# Patient Record
Sex: Female | Born: 1935 | Race: White | Hispanic: No | Smoking: Never smoker
Health system: Southern US, Community
[De-identification: ages and names within clinical notes are randomized; demographics above are authoritative.]

## PROBLEM LIST (undated history)

## (undated) DIAGNOSIS — R9389 Abnormal findings on diagnostic imaging of other specified body structures: Secondary | ICD-10-CM

## (undated) DIAGNOSIS — F419 Anxiety disorder, unspecified: Secondary | ICD-10-CM

## (undated) DIAGNOSIS — F32A Depression, unspecified: Secondary | ICD-10-CM

## (undated) DIAGNOSIS — E785 Hyperlipidemia, unspecified: Secondary | ICD-10-CM

## (undated) DIAGNOSIS — F329 Major depressive disorder, single episode, unspecified: Secondary | ICD-10-CM

## (undated) DIAGNOSIS — K219 Gastro-esophageal reflux disease without esophagitis: Secondary | ICD-10-CM

## (undated) DIAGNOSIS — S4291XA Fracture of right shoulder girdle, part unspecified, initial encounter for closed fracture: Secondary | ICD-10-CM

## (undated) DIAGNOSIS — R0602 Shortness of breath: Secondary | ICD-10-CM

## (undated) DIAGNOSIS — J189 Pneumonia, unspecified organism: Secondary | ICD-10-CM

## (undated) DIAGNOSIS — J45909 Unspecified asthma, uncomplicated: Secondary | ICD-10-CM

## (undated) DIAGNOSIS — I5189 Other ill-defined heart diseases: Secondary | ICD-10-CM

## (undated) DIAGNOSIS — IMO0002 Reserved for concepts with insufficient information to code with codable children: Secondary | ICD-10-CM

## (undated) DIAGNOSIS — K279 Peptic ulcer, site unspecified, unspecified as acute or chronic, without hemorrhage or perforation: Secondary | ICD-10-CM

## (undated) DIAGNOSIS — Z8719 Personal history of other diseases of the digestive system: Secondary | ICD-10-CM

## (undated) DIAGNOSIS — I10 Essential (primary) hypertension: Secondary | ICD-10-CM

## (undated) DIAGNOSIS — R05 Cough: Secondary | ICD-10-CM

## (undated) DIAGNOSIS — R053 Chronic cough: Secondary | ICD-10-CM

## (undated) DIAGNOSIS — M1990 Unspecified osteoarthritis, unspecified site: Secondary | ICD-10-CM

## (undated) HISTORY — PX: TOE SURGERY: SHX1073

## (undated) HISTORY — PX: DILATION AND CURETTAGE OF UTERUS: SHX78

## (undated) HISTORY — PX: HAMMER TOE SURGERY: SHX385

## (undated) HISTORY — PX: CATARACT EXTRACTION W/ INTRAOCULAR LENS  IMPLANT, BILATERAL: SHX1307

---
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
## 1987-10-15 HISTORY — PX: BREAST BIOPSY: SHX20

## 2000-01-03 ENCOUNTER — Encounter: Payer: Self-pay | Admitting: Geriatric Medicine

## 2000-01-03 ENCOUNTER — Encounter: Admission: RE | Admit: 2000-01-03 | Discharge: 2000-01-03 | Payer: Self-pay | Admitting: Geriatric Medicine

## 2000-04-04 ENCOUNTER — Other Ambulatory Visit: Admission: RE | Admit: 2000-04-04 | Discharge: 2000-04-04 | Payer: Self-pay | Admitting: Geriatric Medicine

## 2000-06-18 ENCOUNTER — Encounter: Payer: Self-pay | Admitting: Geriatric Medicine

## 2000-06-18 ENCOUNTER — Encounter: Admission: RE | Admit: 2000-06-18 | Discharge: 2000-06-18 | Payer: Self-pay | Admitting: Geriatric Medicine

## 2000-06-25 ENCOUNTER — Encounter: Admission: RE | Admit: 2000-06-25 | Discharge: 2000-07-25 | Payer: Self-pay | Admitting: Geriatric Medicine

## 2000-07-14 ENCOUNTER — Emergency Department (HOSPITAL_COMMUNITY): Admission: EM | Admit: 2000-07-14 | Discharge: 2000-07-15 | Payer: Self-pay | Admitting: Emergency Medicine

## 2000-07-18 ENCOUNTER — Encounter: Payer: Self-pay | Admitting: Sports Medicine

## 2000-07-18 ENCOUNTER — Ambulatory Visit (HOSPITAL_COMMUNITY): Admission: RE | Admit: 2000-07-18 | Discharge: 2000-07-18 | Payer: Self-pay | Admitting: Sports Medicine

## 2000-08-01 ENCOUNTER — Ambulatory Visit (HOSPITAL_COMMUNITY): Admission: RE | Admit: 2000-08-01 | Discharge: 2000-08-01 | Payer: Self-pay | Admitting: Sports Medicine

## 2000-08-01 ENCOUNTER — Encounter: Payer: Self-pay | Admitting: Sports Medicine

## 2000-08-14 ENCOUNTER — Encounter: Payer: Self-pay | Admitting: Sports Medicine

## 2000-08-14 ENCOUNTER — Ambulatory Visit (HOSPITAL_COMMUNITY): Admission: RE | Admit: 2000-08-14 | Discharge: 2000-08-14 | Payer: Self-pay | Admitting: Sports Medicine

## 2004-01-10 ENCOUNTER — Encounter: Admission: RE | Admit: 2004-01-10 | Discharge: 2004-01-10 | Payer: Self-pay | Admitting: Geriatric Medicine

## 2004-05-07 ENCOUNTER — Other Ambulatory Visit: Admission: RE | Admit: 2004-05-07 | Discharge: 2004-05-07 | Payer: Self-pay | Admitting: Obstetrics and Gynecology

## 2006-07-11 ENCOUNTER — Encounter: Admission: RE | Admit: 2006-07-11 | Discharge: 2006-07-11 | Payer: Self-pay | Admitting: Geriatric Medicine

## 2008-01-07 ENCOUNTER — Encounter: Admission: RE | Admit: 2008-01-07 | Discharge: 2008-01-07 | Payer: Self-pay | Admitting: Geriatric Medicine

## 2010-03-20 ENCOUNTER — Emergency Department (HOSPITAL_BASED_OUTPATIENT_CLINIC_OR_DEPARTMENT_OTHER): Admission: EM | Admit: 2010-03-20 | Discharge: 2010-03-20 | Payer: Self-pay | Admitting: Emergency Medicine

## 2010-03-20 ENCOUNTER — Ambulatory Visit: Payer: Self-pay | Admitting: Diagnostic Radiology

## 2010-04-13 DIAGNOSIS — R9389 Abnormal findings on diagnostic imaging of other specified body structures: Secondary | ICD-10-CM

## 2010-04-13 HISTORY — DX: Abnormal findings on diagnostic imaging of other specified body structures: R93.89

## 2010-04-27 ENCOUNTER — Ambulatory Visit: Payer: Self-pay | Admitting: Internal Medicine

## 2010-04-27 DIAGNOSIS — H409 Unspecified glaucoma: Secondary | ICD-10-CM | POA: Insufficient documentation

## 2010-04-27 DIAGNOSIS — R05 Cough: Secondary | ICD-10-CM

## 2010-04-27 DIAGNOSIS — R0609 Other forms of dyspnea: Secondary | ICD-10-CM

## 2010-04-27 DIAGNOSIS — K279 Peptic ulcer, site unspecified, unspecified as acute or chronic, without hemorrhage or perforation: Secondary | ICD-10-CM | POA: Insufficient documentation

## 2010-04-27 DIAGNOSIS — E7800 Pure hypercholesterolemia, unspecified: Secondary | ICD-10-CM

## 2010-04-27 DIAGNOSIS — K449 Diaphragmatic hernia without obstruction or gangrene: Secondary | ICD-10-CM | POA: Insufficient documentation

## 2010-04-27 DIAGNOSIS — I10 Essential (primary) hypertension: Secondary | ICD-10-CM | POA: Insufficient documentation

## 2010-04-27 DIAGNOSIS — M1990 Unspecified osteoarthritis, unspecified site: Secondary | ICD-10-CM | POA: Insufficient documentation

## 2010-04-27 DIAGNOSIS — R0989 Other specified symptoms and signs involving the circulatory and respiratory systems: Secondary | ICD-10-CM

## 2010-04-27 DIAGNOSIS — J984 Other disorders of lung: Secondary | ICD-10-CM

## 2010-04-28 DIAGNOSIS — J309 Allergic rhinitis, unspecified: Secondary | ICD-10-CM | POA: Insufficient documentation

## 2010-04-28 DIAGNOSIS — E785 Hyperlipidemia, unspecified: Secondary | ICD-10-CM | POA: Insufficient documentation

## 2010-05-10 ENCOUNTER — Encounter: Admission: RE | Admit: 2010-05-10 | Discharge: 2010-05-10 | Payer: Self-pay | Admitting: Geriatric Medicine

## 2010-06-07 ENCOUNTER — Ambulatory Visit: Payer: Self-pay | Admitting: Internal Medicine

## 2010-08-15 ENCOUNTER — Encounter: Admission: RE | Admit: 2010-08-15 | Discharge: 2010-08-15 | Payer: Self-pay | Admitting: Geriatric Medicine

## 2010-09-28 ENCOUNTER — Ambulatory Visit: Payer: Self-pay | Admitting: Internal Medicine

## 2010-10-14 DIAGNOSIS — J189 Pneumonia, unspecified organism: Secondary | ICD-10-CM

## 2010-10-14 HISTORY — DX: Pneumonia, unspecified organism: J18.9

## 2010-11-04 ENCOUNTER — Encounter: Payer: Self-pay | Admitting: Geriatric Medicine

## 2010-11-13 NOTE — Assessment & Plan Note (Signed)
Summary: rov 1 month///kp   Primary Yicel Shannon/Referring Yuritza Paulhus:  Pete Glatter  CC:  1 month follow up visit-cough -dry throat not the deep cough like before. No SOB and doing good since being on QVAR.Marland Kitchen  History of Present Illness: History of Present Illness: April 27, 2010- 89 yoF who comes today with her daughter-in-law on kind referral by Dr Pete Glatter because of cough. Never smoker. She complains of paroxysmal cough at the level of her throat, starting about 8 months ago. Talking makes it worse and sips or cough drops help. She was tried with a change of BP med off ACEinhibitor, and with a course of azithromycin without much help. She also describes shortness of breath which has been stable over a longer period of time- indefinite. This is worse if she is upset,. It is not associated with wheeze, swelling or palpitation. Also, she had an abnormal CXR in June, with no prior hx pneumonia. Stable left lower lobe consolidation is described on f/u CXR 04/19/10, with enlarged heart and midspine compressions. F/U w/ CT was suggested to R/O mass. She denies waking with cough or dyspnea and denies postnasal drip, dysphagia or reflux. She has heard some wheeze. Only uses rescue inhaler if short of breath.  No hx TB exposure or asthma.  June 07, 2010- allergic rhinitis, Lung nodule, dyspnea.....................DIL here Qvar sample last time seemed to help the dyspnea complaint, used once daily. She is coughing somewhat less. Little sputum or wheeze, no chest pain or palpitation. Dr Pete Glatter is following her lung nodule. He did CT she reports showed a scar, and she goes tomorrow for another CXR.     Preventive Screening-Counseling & Management  Alcohol-Tobacco     Smoking Status: never  Current Medications (verified): 1)  Norvasc 5 Mg Tabs (Amlodipine Besylate) .... Take 1 By Mouth Once Daily 2)  Hydrochlorothiazide 12.5 Mg Caps (Hydrochlorothiazide) .... Take 1 By Mouth Once Daily 3)  Lumigan 0.03 %  Soln (Bimatoprost) .Marland Kitchen.. 1 Drop Into Affected Eye Every Evening At Bedtime 4)  Cosopt 22.3-6.8 Mg/ml Soln (Dorzolamide Hcl-Timolol Mal) .Marland Kitchen.. 1 Drop Into Affected Eye Once Daily 5)  Multivitamins  Caps (Multiple Vitamin) .... Take 1 By Mouth Once Daily 6)  Calcium-Vitamin D 600-200 Mg-Unit Tabs (Calcium-Vitamin D) .... Take 1 By Mouth Two Times A Day 7)  Aspirin 81 Mg Tbec (Aspirin) .... Take 1 By Mouth Once Daily 8)  Tylenol With Codeine #3 300-30 Mg Tabs (Acetaminophen-Codeine) .... Take 1 By Mouth As Needed 9)  Metoprolol Succinate 50 Mg Xr24h-Tab (Metoprolol Succinate) .... Take 1 By Mouth Once Daily 10)  Celexa 40 Mg Tabs (Citalopram Hydrobromide) .... Take 1 By Mouth Once Daily 11)  Proair Hfa 108 (90 Base) Mcg/act Aers (Albuterol Sulfate) .... 2 Puffs Four Times A Day As Needed  Allergies (verified): 1)  ! * Cefaclor 2)  ! * Oxycontin  Past History:  Past Surgical History: Last updated: 04/27/2010 Breast biopsy 1989 Toe surgery  Family History: Last updated: 04/27/2010 Asthma-brother(pt of SN) Heart Disease-2 brothers- 1 had Bypass surgery and the other deceased age 71 of MI Cancer-2 brothers-1 liver(drank ETOH) 1 lung(smoked cigars)1982 "oat- cell"deceased.  Social History: Last updated: 04/27/2010 Widowed with children lives alone non smoker no ETOH   Risk Factors: Smoking Status: never (06/07/2010)  Past Medical History: Cough Abnormal CXR- June, 2011 LLL consolidation/ scar Dyspnea Peptic ulcer disease Hyperlipidemia Hypertension Glaucoma Osteoporosis Diastolic dysfunction Mild aortic insufficiency Allergic Rhinitis  Review of Systems      See HPI  The patient complains of non-productive cough.  The patient denies shortness of breath with activity, shortness of breath at rest, productive cough, coughing up blood, chest pain, irregular heartbeats, acid heartburn, indigestion, loss of appetite, weight change, abdominal pain, difficulty swallowing, sore  throat, tooth/dental problems, headaches, nasal congestion/difficulty breathing through nose, and sneezing.    Vital Signs:  Patient profile:   77 year old female Weight:      140 pounds O2 Sat:      96 % on Room air Pulse rate:   67 / minute BP sitting:   132 / 68  (left arm) Cuff size:   regular  Vitals Entered By: Reynaldo Minium CMA (June 07, 2010 11:06 AM)  O2 Flow:  Room air CC: 1 month follow up visit-cough -dry throat not the deep cough like before. No SOB and doing good since being on QVAR.   Physical Exam  Additional Exam:  General: A/Ox3; pleasant and cooperative, NAD, very alert and appropriate SKIN: no rash, lesions NODES: no lymphadenopathy HEENT: [place]/AT, EOM- WNL, Conjuctivae- clear, PERRLA, TM-WNL, Nose- clear, Throat- clear and wnl, Mallampati  II, missing teeth NECK: Supple w/ fair ROM, JVD- none, normal carotid impulses w/o bruits Thyroid- normal to palpation CHEST: Clear to P&A. Can demonstrate a coarse voluntary cough HEART: RRR, no m/g/r heard ABDOMEN: Soft ,  OZH:YQMV, nl pulses, no edema. Left foot in soft boot  NEURO: Grossly intact to observation      Impression & Recommendations:  Problem # 1:  DYSPNEA ON EXERTION (ICD-786.09)  Bronchitis with dyspnea and cough. We will put her on maintenance Qvar for awhile and discussed mouth care. Her updated medication list for this problem includes:    Hydrochlorothiazide 12.5 Mg Caps (Hydrochlorothiazide) .Marland Kitchen... Take 1 by mouth once daily    Metoprolol Succinate 50 Mg Xr24h-tab (Metoprolol succinate) .Marland Kitchen... Take 1 by mouth once daily    Proair Hfa 108 (90 Base) Mcg/act Aers (Albuterol sulfate) .Marland Kitchen... 2 puffs four times a day as needed    Qvar 40 Mcg/act Aers (Beclomethasone dipropionate) .Marland Kitchen... 2 puffs and rinse mouth, twice daily  Problem # 2:  LUNG NODULE (ICD-518.89)  Probably a scar based on her understanding of the CT done by Dr Pete Glatter. I will leave that to him unless needed.  Medications Added to  Medication List This Visit: 1)  Qvar 40 Mcg/act Aers (Beclomethasone dipropionate) .... 2 puffs and rinse mouth, twice daily  Other Orders: Est. Patient Level III (78469)  Patient Instructions: 1)  Please schedule a follow-up appointment in 4 months. 2)  Script for Qvar 40, to take 2 puffs then rinse yuour mouth, twice daily every day.  3)  You can still take the Proair rescue inhaler 2 puffs, up to 4 times daily, if needed for cough/ wheeze/ chest tightness. 4)  cc Dr Pete Glatter Prescriptions: QVAR 40 MCG/ACT AERS (BECLOMETHASONE DIPROPIONATE) 2 PUFFS AND RINSE MOUTH, TWICE DAILY  #1 x PRN   Entered and Authorized by:   Waymon Budge MD   Signed by:   Waymon Budge MD on 06/07/2010   Method used:   Electronically to        [id number]* (retail)       [address]       [place], [place]  [address]       Ph: [phone] or [phone]       Fax: [phone]   RxID:  [id number]    

## 2010-11-13 NOTE — Assessment & Plan Note (Signed)
Summary: persistant cough x 6 months/apc   Primary Provider/Referring Provider:  Stoneking  CC:  Pulmonary Consult-Dr. Stoneking-persistant cough x  6 months..  History of Present Illness: April 27, 2010- 77 yoF who comes today with her daughter-in-law on kind referral by Dr Pete Glatter because of cough. Never smoker. She complains of paroxysmal cough at the level of her throat, starting about 8 months ago. Talking makes it worse and sips or cough drops help. She was tried with a change of BP med off ACEinhibitor, and with a course of azithromycin without much help. She also describes shortness of breath which has been stable over a longer period of time- indefinite. This is worse if she is upset,. It is not associated with wheeze, swelling or palpitation. Also, she had an abnormal CXR in June, with no prior hx pneumonia. Stable left lower lobe consolidation is described on f/u CXR 04/19/10, with enlarged heart and midspine compressions. F/U w/ CT was suggested to R/O mass. She denies waking with cough or dyspnea and denies postnasal drip, dysphagia or reflux. She has heard some wheeze. Only uses rescue inhaler if short of breath.  No hx TB exposure or asthma.  Preventive Screening-Counseling & Management  Alcohol-Tobacco     Smoking Status: never  Current Medications (verified): 1)  Norvasc 5 Mg Tabs (Amlodipine Besylate) .... Take 1 By Mouth Once Daily 2)  Hydrochlorothiazide 12.5 Mg Caps (Hydrochlorothiazide) .... Take 1 By Mouth Once Daily 3)  Lumigan 0.03 % Soln (Bimatoprost) .Marland Kitchen.. 1 Drop Into Affected Eye Every Evening At Bedtime 4)  Cosopt 22.3-6.8 Mg/ml Soln (Dorzolamide Hcl-Timolol Mal) .Marland Kitchen.. 1 Drop Into Affected Eye Once Daily 5)  Multivitamins  Caps (Multiple Vitamin) .... Take 1 By Mouth Once Daily 6)  Calcium-Vitamin D 600-200 Mg-Unit Tabs (Calcium-Vitamin D) .... Take 1 By Mouth Two Times A Day 7)  Aspirin 81 Mg Tbec (Aspirin) .... Take 1 By Mouth Once Daily 8)  Zocor 40 Mg Tabs  (Simvastatin) .... Take 1/2 By Mouth Once Daily 9)  Tylenol With Codeine #3 300-30 Mg Tabs (Acetaminophen-Codeine) .... Take 1 By Mouth As Needed 10)  Metoprolol Succinate 50 Mg Xr24h-Tab (Metoprolol Succinate) .... Take 1 By Mouth Once Daily 11)  Celexa 40 Mg Tabs (Citalopram Hydrobromide) .... Take 1 By Mouth Once Daily 12)  Proair Hfa 108 (90 Base) Mcg/act Aers (Albuterol Sulfate) .... 2 Puffs Four Times A Day As Needed  Allergies (verified): 1)  ! * Cefaclor 2)  ! * Oxycontin  Past History:  Past Medical History: Cough Abnormal CXR- June, 2011 LLL consolidation Dyspnea Peptic ulcer disease Hyperlipidemia Hypertension Glaucoma Osteoporosis Diastolic dysfunction Mild aortic insufficiency Allergic Rhinitis  Past Surgical History: Breast biopsy 1989 Toe surgery  Family History: Asthma-brother(pt of SN) Heart Disease-2 brothers- 1 had Bypass surgery and the other deceased age 37 of MI Cancer-2 brothers-1 liver(drank ETOH) 1 lung(smoked cigars)1982 "oat- cell"deceased.  Social History: Widowed with children lives alone non smoker no ETOH Smoking Status:  never  Review of Systems      See HPI       The patient complains of shortness of breath with activity, loss of appetite, weight change, tooth/dental problems, anxiety, and depression.  The patient denies shortness of breath at rest, productive cough, non-productive cough, coughing up blood, chest pain, irregular heartbeats, acid heartburn, indigestion, abdominal pain, difficulty swallowing, sore throat, headaches, nasal congestion/difficulty breathing through nose, sneezing, itching, ear ache, hand/feet swelling, joint stiffness or pain, rash, change in color of mucus, and fever.  Vital Signs:  Patient profile:   77 year old female Weight:      139 pounds O2 Sat:      92 % on Room air Pulse rate:   80 / minute BP sitting:   140 / 76  (right arm) Cuff size:   regular  Vitals Entered By: Reynaldo Minium CMA (April 27, 2010 2:18 PM)  O2 Flow:  Room air CC: Pulmonary Consult-Dr. Stoneking-persistant cough x  6 months.   Physical Exam  Additional Exam:  General: A/Ox3; pleasant and cooperative, NAD, very alert and appropriate SKIN: no rash, lesions NODES: no lymphadenopathy HEENT: [place]/AT, EOM- WNL, Conjuctivae- clear, PERRLA, TM-WNL, Nose- clear, Throat- clear and wnl, Mallampati  II, missing teeth NECK: Supple w/ fair ROM, JVD- none, normal carotid impulses w/o bruits Thyroid- normal to palpation CHEST: Clear to P&A HEART: RRR, no m/g/r heard ABDOMEN: Soft , not distended, no enlargement of liver or spleen ZOX:WRUE, nl pulses, no edema. Left foot in soft boot  NEURO: Grossly intact to observation      Impression & Recommendations:  Problem # 1:  DYSPNEA ON EXERTION (ICD-786.09)  This is a long standing, nonprogressive problem and may not require much intervention. We will see what effect a steroid inhaler might have. Her updated medication list for this problem includes:    Hydrochlorothiazide 12.5 Mg Caps (Hydrochlorothiazide) .Marland Kitchen... Take 1 by mouth once daily    Metoprolol Succinate 50 Mg Xr24h-tab (Metoprolol succinate) .Marland Kitchen... Take 1 by mouth once daily    Proair Hfa 108 (90 Base) Mcg/act Aers (Albuterol sulfate) .Marland Kitchen... 2 puffs four times a day as needed  Problem # 2:  LUNG NODULE (ICD-518.89) This would seem to be the problem most needing resolution.  This may be followed conservatively with CXR respecting her age, but if there is concern, then CT is going to better define it. We were unable to open the images she brought with her- "blank disk". if this is more than a nodule then it may explain her cough. She reports Dr Pete Glatter has plan to f/u woith another CXR soon. If there is a persistent consolidation then I would look for hm to consider CT. I will help as needed.  Problem # 3:  COUGH (ICD-786.2)  We will see if a steroid inhaler has any benefit, while not doing harm pending  consideration of her abnormal CXR. She is on Cosopt eyedrops (containing timolol) for glaucoma. Timolol is quite capable of causing cough, but the question is why she would start having a cough when she has reportedly been on the medicine for several years.  Medications Added to Medication List This Visit: 1)  Norvasc 5 Mg Tabs (Amlodipine besylate) .... Take 1 by mouth once daily 2)  Hydrochlorothiazide 12.5 Mg Caps (Hydrochlorothiazide) .... Take 1 by mouth once daily 3)  Lumigan 0.03 % Soln (Bimatoprost) .Marland Kitchen.. 1 drop into affected eye every evening at bedtime 4)  Cosopt 22.3-6.8 Mg/ml Soln (Dorzolamide hcl-timolol mal) .Marland Kitchen.. 1 drop into affected eye once daily 5)  Multivitamins Caps (Multiple vitamin) .... Take 1 by mouth once daily 6)  Calcium-vitamin D 600-200 Mg-unit Tabs (Calcium-vitamin d) .... Take 1 by mouth two times a day 7)  Aspirin 81 Mg Tbec (Aspirin) .... Take 1 by mouth once daily 8)  Zocor 40 Mg Tabs (Simvastatin) .... Take 1/2 by mouth once daily 9)  Tylenol With Codeine #3 300-30 Mg Tabs (Acetaminophen-codeine) .... Take 1 by mouth as needed 10)  Metoprolol Succinate 50 Mg Xr24h-tab (  Metoprolol succinate) .... Take 1 by mouth once daily 11)  Celexa 40 Mg Tabs (Citalopram hydrobromide) .... Take 1 by mouth once daily 12)  Proair Hfa 108 (90 Base) Mcg/act Aers (Albuterol sulfate) .... 2 puffs four times a day as needed  Other Orders: Est. Patient Level IV ([address])  Patient Instructions: 1)  Please schedule a follow-up appointment in 1 month. 2)  Sample steroid inhaler: Qvar 80: 2 puffs and rinse mouth, once daily 3)  Keep appointmet to follow up with Dr Pete Glatter on your chest xray.

## 2010-11-15 NOTE — Assessment & Plan Note (Signed)
Summary: 4 months/apc   Primary Neveah Bang/Referring Mayco Walrond:  Stoneking  CC:  4 month follow up visit-SOB has gotten better but cough is still hanging arlound. and Hypertension Management.  History of Present Illness: History of Present Illness: April 27, 2010- 89 yoF who comes today with her daughter-in-law on kind referral by Dr Pete Glatter because of cough. Never smoker. She complains of paroxysmal cough at the level of her throat, starting about 8 months ago. Talking makes it worse and sips or cough drops help. She was tried with a change of BP med off ACEinhibitor, and with a course of azithromycin without much help. She also describes shortness of breath which has been stable over a longer period of time- indefinite. This is worse if she is upset,. It is not associated with wheeze, swelling or palpitation. Also, she had an abnormal CXR in June, with no prior hx pneumonia. Stable left lower lobe consolidation is described on f/u CXR 04/19/10, with enlarged heart and midspine compressions. F/U w/ CT was suggested to R/O mass. She denies waking with cough or dyspnea and denies postnasal drip, dysphagia or reflux. She has heard some wheeze. Only uses rescue inhaler if short of breath.  No hx TB exposure or asthma.  June 07, 2010- allergic rhinitis, Lung nodule, dyspnea.....................DIL here Qvar sample last time seemed to help the dyspnea complaint, used once daily. She is coughing somewhat less. Little sputum or wheeze, no chest pain or palpitation. Dr Pete Glatter is following her lung nodule. He did CT she reports showed a scar, and she goes tomorrow for another CXR.   September 28, 2010-  allergic rhinitis, Lung nodule, dyspnea........DIL here Nurse-CC: 4 month follow up visit-SOB has gotten better but cough is still hanging around. She reports lung spots had cleared after antibiotics and issue is resolved.  Shortness of breath is better. Still has cough episodes that can wear her out.  Denies awareness of reflux or of choking with swallowing. Wheeze not related to cough.. Denies postnasal drip. Amount of cough is variable- but unclear if related to weather or activity. Discussed her lack of success with many cough meds.  Qvar 40 is not better than 80.   Hypertension History:      Positive major cardiovascular risk factors include female age 65 years old or older, hyperlipidemia, and hypertension.  Negative major cardiovascular risk factors include non-tobacco-user status.     Preventive Screening-Counseling & Management  Alcohol-Tobacco     Smoking Status: never  Current Medications (verified): 1)  Norvasc 5 Mg Tabs (Amlodipine Besylate) .... Take 1 By Mouth Once Daily 2)  Hydrochlorothiazide 12.5 Mg Caps (Hydrochlorothiazide) .... Take 1 By Mouth Once Daily 3)  Lumigan 0.03 % Soln (Bimatoprost) .Marland Kitchen.. 1 Drop Into Affected Eye Every Evening At Bedtime 4)  Cosopt 22.3-6.8 Mg/ml Soln (Dorzolamide Hcl-Timolol Mal) .Marland Kitchen.. 1 Drop Into Affected Eye Once Daily 5)  Multivitamins  Caps (Multiple Vitamin) .... Take 1 By Mouth Once Daily 6)  Calcium-Vitamin D 600-200 Mg-Unit Tabs (Calcium-Vitamin D) .... Take 1 By Mouth Two Times A Day 7)  Aspirin 81 Mg Tbec (Aspirin) .... Take 1 By Mouth Once Daily 8)  Tylenol With Codeine #3 300-30 Mg Tabs (Acetaminophen-Codeine) .... Take 1 By Mouth As Needed 9)  Metoprolol Succinate 50 Mg Xr24h-Tab (Metoprolol Succinate) .... Take 1 By Mouth Once Daily 10)  Celexa 40 Mg Tabs (Citalopram Hydrobromide) .... Take 1 By Mouth Once Daily 11)  Proair Hfa 108 (90 Base) Mcg/act Aers (Albuterol Sulfate) .... 2 Puffs Four  Times A Day As Needed 12)  Qvar 40 Mcg/act Aers (Beclomethasone Dipropionate) .... 2 Puffs and Rinse Mouth, Twice Daily 13)  Vitamin D3 50000 Unit Caps (Cholecalciferol) .... Take 1 By Mouth Weekly 14)  Calcitonin ([place]) 200 Unit/act Soln (Calcitonin (Sal[place])) .Marland Kitchen.. 1 Spray Each Nostril Once Daily  Allergies (verified): 1)  ! *  Cefaclor 2)  ! * Oxycontin  Past History:  Past Medical History: Last updated: 06/07/2010 Cough Abnormal CXR- June, 2011 LLL consolidation/ scar Dyspnea Peptic ulcer disease Hyperlipidemia Hypertension Glaucoma Osteoporosis Diastolic dysfunction Mild aortic insufficiency Allergic Rhinitis  Past Surgical History: Last updated: 04/27/2010 Breast biopsy 1989 Toe surgery  Family History: Last updated: 04/27/2010 Asthma-brother(pt of SN) Heart Disease-2 brothers- 1 had Bypass surgery and the other deceased age 12 of MI Cancer-2 brothers-1 liver(drank ETOH) 1 lung(smoked cigars)1982 "oat- cell"deceased.  Social History: Last updated: 04/27/2010 Widowed with children lives alone non smoker no ETOH   Risk Factors: Smoking Status: never (09/28/2010)  Review of Systems      See HPI       The patient complains of productive cough and non-productive cough.  The patient denies shortness of breath with activity, shortness of breath at rest, coughing up blood, chest pain, irregular heartbeats, acid heartburn, indigestion, loss of appetite, weight change, abdominal pain, difficulty swallowing, sore throat, tooth/dental problems, headaches, nasal congestion/difficulty breathing through nose, sneezing, itching, ear ache, and anxiety.    Vital Signs:  Patient profile:   77 year old female Weight:      140.38 pounds O2 Sat:      98 % on Room air Pulse rate:   71 / minute BP sitting:   116 / 68  (left arm) Cuff size:   regular  Vitals Entered By: Reynaldo Minium CMA (September 28, 2010 10:42 AM)  O2 Flow:  Room air CC: 4 month follow up visit-SOB has gotten better but cough is still hanging arlound., Hypertension Management   Physical Exam  Additional Exam:  General: A/Ox3; pleasant and cooperative, NAD, very alert and appropriate SKIN: no rash, lesions NODES: no lymphadenopathy HEENT: /AT, EOM- WNL, Conjuctivae- clear, PERRLA, TM-WNL, Nose- clear, Throat-minor glandular  erythema back of throat, Mallampati  II, missing teeth NECK: Supple w/ fair ROM, JVD- none, normal carotid impulses w/o bruits Thyroid- normal to palpation CHEST: Clear to P&A. Not coughing here HEART: RRR, no m/g/r heard ABDOMEN: Soft ,  [id number], nl pulses, no edema.   NEURO: Grossly intact to observation      Impression & Recommendations:  Problem # 1:  COUGH (ICD-786.2)  She isn't doing badly. We have gone over common explanations for chronic cough yet again. i will let her try Gabapentin for cough suppression.   Orders: Est. Patient Level III (47829)  Problem # 2:  LUNG NODULE (ICD-518.89)  She reports this problem resolved. We will watch over time as occasionally imaging can miss something.   Orders: Est. Patient Level III ([id number])  Problem # 3:  ALLERGIC RHINITIS (ICD-477.9)  Limited postnasal drip is scant now and clear, without sneeze or significant stuffiness.  Medications Added to Medication List This Visit: 1)  Vitamin D3 50000 Unit Caps (Cholecalciferol) .... Take 1 by mouth weekly 2)  Calcitonin (salmon) 200 Unit/act Soln (Calcitonin (salmon)) .Marland Kitchen.. 1 spray each nostril once daily 3)  Gabapentin 100 Mg Caps (Gabapentin) .... 1. three times a day as needed cough  Hypertension Assessment/Plan:      The patient's hypertensive risk group is category B: At least one risk  factor (excluding diabetes) with no target organ damage.  Today's blood pressure is 116/68.    Patient Instructions: 1)  Please schedule a follow-up appointment in 2 months. 2)  Script for gabapentin for cough sent to your drug store.  Prescriptions: GABAPENTIN 100 MG CAPS (GABAPENTIN) 1. three times a day as needed cough  #30 x 2   Entered and Authorized by:   Waymon Budge MD   Signed by:   Waymon Budge MD on 09/28/2010   Method used:   Electronically to        [id number] (retail)       [address]       [place], [place]  [address]        Ph: [phone] or [phone]       Fax: [phone]   RxID:   [id number]

## 2010-11-27 ENCOUNTER — Ambulatory Visit: Payer: Self-pay | Admitting: Internal Medicine

## 2010-12-05 ENCOUNTER — Encounter: Payer: Self-pay | Admitting: Internal Medicine

## 2010-12-05 ENCOUNTER — Ambulatory Visit (INDEPENDENT_AMBULATORY_CARE_PROVIDER_SITE_OTHER): Payer: MEDICARE | Admitting: Internal Medicine

## 2010-12-05 DIAGNOSIS — J984 Other disorders of lung: Secondary | ICD-10-CM

## 2010-12-05 DIAGNOSIS — R05 Cough: Secondary | ICD-10-CM

## 2010-12-11 NOTE — Assessment & Plan Note (Signed)
Summary: rov 2 months/kp   Primary Provider/Referring Provider:  Stoneking  CC:  2 month follow up visit-occasional wheezing and cough-Tessalon pearls provides no help.Molly Cortez  History of Present Illness: June 07, 2010- allergic rhinitis, Lung nodule, dyspnea.....................DIL here Qvar sample last time seemed to help the dyspnea complaint, used once daily. She is coughing somewhat less. Little sputum or wheeze, no chest pain or palpitation. Dr Pete Glatter is following her lung nodule. He did CT she reports showed a scar, and she goes tomorrow for another CXR.   September 28, 2010-  allergic rhinitis, Lung nodule, dyspnea........DIL here Nurse-CC: 4 month follow up visit-SOB has gotten better but cough is still hanging around. She reports lung spots had cleared after antibiotics and issue is resolved.  Shortness of breath is better. Still has cough episodes that can wear her out. Denies awareness of reflux or of choking with swallowing. Wheeze not related to cough.. Denies postnasal drip. Amount of cough is variable- but unclear if related to weather or activity. Discussed her lack of success with many cough meds.  Qvar 40 is not better than 80.  December 05, 2010- allergic rhinitis, Lung nodule (Dr Pete Glatter), dyspnea.............Molly KitchenDiL here Nurse-CC: 2 month follow up visit-occasional wheezing, cough-Tessalon pearls provides no help. Still coughing. Gabapentin did not help. Soothing like with Robitussin, sips of water, throat lozenges helps. Denies postnasal drip, reflux, wheeze, choke with swalloing or other clues. Doesn't remember Tessalon. Saw Dr Pete Glatter with no ongoing concern about lung nodules. Scant mucus or dry cough. Trigger- talking on phone. i have not done CXR here since Dr Pete Glatter is doing these.     Preventive Screening-Counseling & Management  Alcohol-Tobacco     Smoking Status: never  Current Medications (verified): 1)  Norvasc 5 Mg Tabs (Amlodipine Besylate) ....  Take 1 By Mouth Once Daily 2)  Hydrochlorothiazide 12.5 Mg Caps (Hydrochlorothiazide) .... Take 1 By Mouth Once Daily 3)  Lumigan 0.03 % Soln (Bimatoprost) .Molly Cortez.. 1 Drop Into Affected Eye Every Evening At Bedtime 4)  Cosopt 22.3-6.8 Mg/ml Soln (Dorzolamide Hcl-Timolol Mal) .Molly Cortez.. 1 Drop Into Affected Eye Once Daily 5)  Multivitamins  Caps (Multiple Vitamin) .... Take 1 By Mouth Once Daily 6)  Calcium-Vitamin D 600-200 Mg-Unit Tabs (Calcium-Vitamin D) .... Take 1 By Mouth Two Times A Day 7)  Aspirin 81 Mg Tbec (Aspirin) .... Take 1 By Mouth Once Daily 8)  Tylenol With Codeine #3 300-30 Mg Tabs (Acetaminophen-Codeine) .... Take 1 By Mouth As Needed 9)  Metoprolol Succinate 50 Mg Xr24h-Tab (Metoprolol Succinate) .... Take 1 By Mouth Once Daily 10)  Celexa 40 Mg Tabs (Citalopram Hydrobromide) .... Take 1 By Mouth Once Daily 11)  Proair Hfa 108 (90 Base) Mcg/act Aers (Albuterol Sulfate) .... 2 Puffs Four Times A Day As Needed 12)  Qvar 40 Mcg/act Aers (Beclomethasone Dipropionate) .... 2 Puffs and Rinse Mouth, Twice Daily 13)  Calcitonin (Salmon) 200 Unit/act Soln (Calcitonin (Salmon)) .Molly Cortez.. 1 Spray Each Nostril Once Daily 14)  Gabapentin 100 Mg Caps (Gabapentin) .... 1. Three Times A Day As Needed Cough 15)  Fish Oil 1000 Mg Caps (Omega-3 Fatty Acids) .... Take 1 By Mouth Once Daily  Allergies (verified): 1)  ! * Cefaclor 2)  ! * Oxycontin  Past History:  Past Medical History: Last updated: 06/07/2010 Cough Abnormal CXR- June, 2011 LLL consolidation/ scar Dyspnea Peptic ulcer disease Hyperlipidemia Hypertension Glaucoma Osteoporosis Diastolic dysfunction Mild aortic insufficiency Allergic Rhinitis  Past Surgical History: Last updated: 04/27/2010 Breast biopsy 1989 Toe surgery  Family History:  Last updated: 04/27/2010 Asthma-brother(pt of SN) Heart Disease-2 brothers- 1 had Bypass surgery and the other deceased age 58 of MI Cancer-2 brothers-1 liver(drank ETOH) 1 lung(smoked  cigars)1982 "oat- cell"deceased.  Social History: Last updated: 04/27/2010 Widowed with children lives alone non smoker no ETOH   Risk Factors: Smoking Status: never (12/05/2010)  Review of Systems      See HPI       The patient complains of non-productive cough.  The patient denies shortness of breath with activity, shortness of breath at rest, productive cough, chest pain, irregular heartbeats, acid heartburn, indigestion, loss of appetite, weight change, abdominal pain, difficulty swallowing, sore throat, tooth/dental problems, headaches, nasal congestion/difficulty breathing through nose, and sneezing.    Vital Signs:  Patient profile:   77 year old female Height:      58 inches Weight:      143 pounds BMI:     30.00 O2 Sat:      96 % on Room air Pulse rate:   67 / minute BP sitting:   132 / 72  (left arm) Cuff size:   regular  Vitals Entered By: Reynaldo Minium CMA (December 05, 2010 9:23 AM)  O2 Flow:  Room air CC: 2 month follow up visit-occasional wheezing, cough-Tessalon pearls provides no help.   Physical Exam  Additional Exam:  General: A/Ox3; pleasant and cooperative, NAD, very alert and appropriate SKIN: no rash, lesions NODES: no lymphadenopathy HEENT: [place]/AT, EOM- WNL, Conjuctivae- clear, PERRLA, TM-WNL, Nose- clear, Throat-minor glandular erythema back of throat noted again today, Mallampati  II, missing teeth NECK: Supple w/ fair ROM, JVD- none, normal carotid impulses w/o bruits Thyroid- normal to palpation CHEST: Clear to P&A. Mild dry cough here>> took sip of water. . Transient crackles tight base, cleared with deeper breath.  HEART: RRR, no m/g/r heard ABDOMEN: Soft ,  XBJ:YNWG, nl pulses, no edema.   NEURO: Grossly intact to observation      Impression & Recommendations:  Problem # 1:  COUGH (ICD-786.2)  Benign persistent cough- nonspecific. i will let her try tessalon. I made the point that at age 36, it is easier to harm with over aggressive  care and sedating meds. I will invite her to return as needed. She continues Qvar and Proair, but can't tell they help. I suggest she use them up, then stop for comparison.   Medications Added to Medication List This Visit: 1)  Fish Oil 1000 Mg Caps (Omega-3 fatty acids) .... Take 1 by mouth once daily 2)  Benzonatate 100 Mg Caps (Benzonatate) .Molly Cortez.. 1 or 2,  three times a day as needed cough  Other Orders: Est. Patient Level III (95621)  Patient Instructions: 1)  Please schedule a follow-up appointment as needed. 2)  Try the  benzonatate perles for cough.  3)  Continue the otc soothing syrups, sips of liquids and throat lozenges.  4)  Keep watching for clues to your cough- time place, situation or exposure that seems to make you cough.  5)  Use up your Qvar and Proair inhlaers, then stop and watch to see if you feel they were helping.  6)  Continue to see Dr Pete Glatter for his care and follow-up of the lung nodules.  I will be more than happy to see you back if you need.  Prescriptions: BENZONATATE 100 MG CAPS (BENZONATATE) 1 or 2,  three times a day as needed cough  #30 x 5   Entered and Authorized by:   Waymon Budge MD  Signed by:   Waymon Budge MD on 12/05/2010   Method used:   Print then Give to Patient   RxID:   [id number]

## 2011-06-07 ENCOUNTER — Other Ambulatory Visit (HOSPITAL_COMMUNITY): Payer: Self-pay | Admitting: Geriatric Medicine

## 2011-06-07 DIAGNOSIS — T17998A Other foreign object in respiratory tract, part unspecified causing other injury, initial encounter: Secondary | ICD-10-CM

## 2011-06-07 DIAGNOSIS — R05 Cough: Secondary | ICD-10-CM

## 2011-06-19 ENCOUNTER — Ambulatory Visit (HOSPITAL_COMMUNITY)
Admission: RE | Admit: 2011-06-19 | Discharge: 2011-06-19 | Disposition: A | Discharge status: Home / Self Care | Payer: Medicare Other | Source: Ambulatory Visit | Attending: Geriatric Medicine | Admitting: Geriatric Medicine

## 2011-06-19 DIAGNOSIS — R059 Cough, unspecified: Secondary | ICD-10-CM | POA: Insufficient documentation

## 2011-06-19 DIAGNOSIS — T17308A Unspecified foreign body in larynx causing other injury, initial encounter: Secondary | ICD-10-CM | POA: Insufficient documentation

## 2011-06-19 DIAGNOSIS — R05 Cough: Secondary | ICD-10-CM

## 2011-06-19 DIAGNOSIS — T17998A Other foreign object in respiratory tract, part unspecified causing other injury, initial encounter: Secondary | ICD-10-CM

## 2011-06-19 DIAGNOSIS — IMO0002 Reserved for concepts with insufficient information to code with codable children: Secondary | ICD-10-CM | POA: Insufficient documentation

## 2011-09-29 ENCOUNTER — Inpatient Hospital Stay (HOSPITAL_BASED_OUTPATIENT_CLINIC_OR_DEPARTMENT_OTHER)
Admission: EM | Admit: 2011-09-29 | Discharge: 2011-10-03 | DRG: 194 | Disposition: A | Discharge status: Home Health | Payer: Medicare Other | Attending: Internal Medicine | Admitting: Internal Medicine

## 2011-09-29 ENCOUNTER — Emergency Department (INDEPENDENT_AMBULATORY_CARE_PROVIDER_SITE_OTHER): Payer: Medicare Other

## 2011-09-29 ENCOUNTER — Encounter: Payer: Self-pay | Admitting: *Deleted

## 2011-09-29 DIAGNOSIS — H409 Unspecified glaucoma: Secondary | ICD-10-CM | POA: Diagnosis present

## 2011-09-29 DIAGNOSIS — J309 Allergic rhinitis, unspecified: Secondary | ICD-10-CM | POA: Diagnosis present

## 2011-09-29 DIAGNOSIS — K279 Peptic ulcer, site unspecified, unspecified as acute or chronic, without hemorrhage or perforation: Secondary | ICD-10-CM | POA: Diagnosis present

## 2011-09-29 DIAGNOSIS — E871 Hypo-osmolality and hyponatremia: Secondary | ICD-10-CM | POA: Diagnosis present

## 2011-09-29 DIAGNOSIS — L03119 Cellulitis of unspecified part of limb: Secondary | ICD-10-CM | POA: Diagnosis present

## 2011-09-29 DIAGNOSIS — L02419 Cutaneous abscess of limb, unspecified: Secondary | ICD-10-CM | POA: Diagnosis present

## 2011-09-29 DIAGNOSIS — R059 Cough, unspecified: Secondary | ICD-10-CM | POA: Diagnosis present

## 2011-09-29 DIAGNOSIS — Z888 Allergy status to other drugs, medicaments and biological substances status: Secondary | ICD-10-CM

## 2011-09-29 DIAGNOSIS — L0390 Cellulitis, unspecified: Secondary | ICD-10-CM | POA: Diagnosis present

## 2011-09-29 DIAGNOSIS — E7800 Pure hypercholesterolemia, unspecified: Secondary | ICD-10-CM | POA: Diagnosis present

## 2011-09-29 DIAGNOSIS — Z9849 Cataract extraction status, unspecified eye: Secondary | ICD-10-CM

## 2011-09-29 DIAGNOSIS — I10 Essential (primary) hypertension: Secondary | ICD-10-CM | POA: Diagnosis present

## 2011-09-29 DIAGNOSIS — E785 Hyperlipidemia, unspecified: Secondary | ICD-10-CM | POA: Diagnosis present

## 2011-09-29 DIAGNOSIS — R05 Cough: Secondary | ICD-10-CM

## 2011-09-29 DIAGNOSIS — F411 Generalized anxiety disorder: Secondary | ICD-10-CM | POA: Diagnosis present

## 2011-09-29 DIAGNOSIS — R062 Wheezing: Secondary | ICD-10-CM

## 2011-09-29 DIAGNOSIS — Z7982 Long term (current) use of aspirin: Secondary | ICD-10-CM

## 2011-09-29 DIAGNOSIS — D72829 Elevated white blood cell count, unspecified: Secondary | ICD-10-CM | POA: Diagnosis present

## 2011-09-29 DIAGNOSIS — M810 Age-related osteoporosis without current pathological fracture: Secondary | ICD-10-CM | POA: Diagnosis present

## 2011-09-29 DIAGNOSIS — E876 Hypokalemia: Secondary | ICD-10-CM | POA: Diagnosis not present

## 2011-09-29 DIAGNOSIS — K219 Gastro-esophageal reflux disease without esophagitis: Secondary | ICD-10-CM | POA: Diagnosis present

## 2011-09-29 DIAGNOSIS — Z79899 Other long term (current) drug therapy: Secondary | ICD-10-CM

## 2011-09-29 DIAGNOSIS — F3289 Other specified depressive episodes: Secondary | ICD-10-CM | POA: Diagnosis present

## 2011-09-29 DIAGNOSIS — E878 Other disorders of electrolyte and fluid balance, not elsewhere classified: Secondary | ICD-10-CM | POA: Diagnosis present

## 2011-09-29 DIAGNOSIS — F329 Major depressive disorder, single episode, unspecified: Secondary | ICD-10-CM | POA: Diagnosis present

## 2011-09-29 DIAGNOSIS — J189 Pneumonia, unspecified organism: Principal | ICD-10-CM | POA: Diagnosis present

## 2011-09-29 DIAGNOSIS — M129 Arthropathy, unspecified: Secondary | ICD-10-CM | POA: Diagnosis present

## 2011-09-29 HISTORY — DX: Gastro-esophageal reflux disease without esophagitis: K21.9

## 2011-09-29 HISTORY — DX: Depression, unspecified: F32.A

## 2011-09-29 HISTORY — DX: Other ill-defined heart diseases: I51.89

## 2011-09-29 HISTORY — DX: Unspecified osteoarthritis, unspecified site: M19.90

## 2011-09-29 HISTORY — DX: Abnormal findings on diagnostic imaging of other specified body structures: R93.89

## 2011-09-29 HISTORY — DX: Hyperlipidemia, unspecified: E78.5

## 2011-09-29 HISTORY — DX: Major depressive disorder, single episode, unspecified: F32.9

## 2011-09-29 HISTORY — DX: Shortness of breath: R06.02

## 2011-09-29 HISTORY — DX: Peptic ulcer, site unspecified, unspecified as acute or chronic, without hemorrhage or perforation: K27.9

## 2011-09-29 HISTORY — DX: Anxiety disorder, unspecified: F41.9

## 2011-09-29 HISTORY — DX: Cough: R05

## 2011-09-29 HISTORY — DX: Chronic cough: R05.3

## 2011-09-29 HISTORY — DX: Essential (primary) hypertension: I10

## 2011-09-29 LAB — BASIC METABOLIC PANEL
Chloride: 95 mEq/L — ABNORMAL LOW (ref 96–112)
Creatinine, Ser: 0.4 mg/dL — ABNORMAL LOW (ref 0.50–1.10)
GFR calc Af Amer: >90 mL/min (ref >90)
GFR calc non Af Amer: 88 mL/min — ABNORMAL LOW (ref >90)
Glucose, Bld: 108 mg/dL — ABNORMAL HIGH (ref 70–99)

## 2011-09-29 LAB — DIFFERENTIAL
Lymphs Abs: 1.8 10*3/uL (ref 0.7–4.0)
Monocytes Absolute: 2.4 10*3/uL — ABNORMAL HIGH (ref 0.1–1.0)
Monocytes Relative: 14 % — ABNORMAL HIGH (ref 3–12)
Neutro Abs: 12.4 10*3/uL — ABNORMAL HIGH (ref 1.7–7.7)
Neutrophils Relative %: 74 % (ref 43–77)

## 2011-09-29 LAB — CBC
Hemoglobin: 11.5 g/dL — ABNORMAL LOW (ref 12.0–15.0)
MCV: 85.4 fL (ref 78.0–100.0)
Platelets: 443 10*3/uL — ABNORMAL HIGH (ref 150–400)
RBC: 3.9 MIL/uL (ref 3.87–5.11)
WBC: 16.8 10*3/uL — ABNORMAL HIGH (ref 4.0–10.5)

## 2011-09-29 MED ORDER — MOXIFLOXACIN HCL IN NACL 400 MG/250ML IV SOLN
400.0000 mg | Freq: Once | INTRAVENOUS | Status: AC
  Administered 2011-09-29: 400 mg via INTRAVENOUS
  Filled 2011-09-29: qty 250

## 2011-09-29 MED ORDER — LEVALBUTEROL HCL 0.63 MG/3ML IN NEBU
INHALATION_SOLUTION | RESPIRATORY_TRACT | Status: AC
  Administered 2011-09-29: 1.26 mg
  Filled 2011-09-29: qty 6

## 2011-09-29 MED ORDER — ACETAMINOPHEN-CODEINE #3 300-30 MG PO TABS
1.0000 | ORAL_TABLET | Freq: Once | ORAL | Status: AC
  Administered 2011-09-29: 1 via ORAL
  Filled 2011-09-29: qty 1

## 2011-09-29 MED ORDER — SODIUM CHLORIDE 0.9 % IV SOLN
Freq: Once | INTRAVENOUS | Status: AC
  Administered 2011-09-29: 23:00:00 via INTRAVENOUS

## 2011-09-29 MED ORDER — LEVALBUTEROL HCL 1.25 MG/3ML IN NEBU: 1.2500 mg | INHALATION_SOLUTION | Freq: Once | RESPIRATORY_TRACT | Status: DC

## 2011-09-29 NOTE — ED Notes (Signed)
Admission process explained to pt and family

## 2011-09-29 NOTE — ED Provider Notes (Signed)
History     CSN: [id number] Arrival date & time: 09/29/2011  8:29 PM   First MD Initiated Contact with Patient 09/29/11 2035      Chief Complaint  Patient presents with  . Cough    (Consider location/radiation/quality/duration/timing/severity/associated sxs/prior treatment) HPI Comments: Pt states that she had coughing over the last couple of years:pt states that she had a bad spell last night and she started again tonight:pt has been seen by her pcp and a pulmonologist without any answers  Patient is a 77 y.o. female presenting with cough. The history is provided by the patient and a relative. No language interpreter was used.  Cough This is a chronic problem. The current episode started more than 1 week ago. The problem occurs constantly. The problem has been gradually worsening. The cough is productive of sputum. There has been no fever. She has tried nothing for the symptoms. She is not a smoker.    Past Medical History  Diagnosis Date  . Hypertension     Past Surgical History  Procedure Date  . Breast surgery     History reviewed. No pertinent family history.  History  Substance Use Topics  . Smoking status: Never Smoker   . Smokeless tobacco: Not on file  . Alcohol Use: No    OB History    Grav Para Term Preterm Abortions TAB SAB Ect Mult Living                  Review of Systems  Respiratory: Positive for cough.   All other systems reviewed and are negative.    Allergies  Cefaclor and Oxycodone hcl  Home Medications  No current outpatient prescriptions on file.  BP 161/64  Pulse 102  Temp(Src) 99.9 F (37.7 C) (Oral)  Resp 20  Ht 4\' 10"  (1.473 m)  Wt 135 lb (61.236 kg)  BMI 28.22 kg/m2  SpO2 92%  Physical Exam  Nursing note and vitals reviewed. Constitutional: She appears well-developed and well-nourished.  HENT:  Head: Normocephalic.  Neck: Normal range of motion. Neck supple.  Pulmonary/Chest: She has wheezes.       Left sided  wheezing noted:pt in no acute distress  Abdominal: Soft.  Musculoskeletal: Normal range of motion.  Neurological: She is alert.  Skin:       Pt has area a redness and warmth to her left lower shin    ED Course  Procedures (including critical care time)  Labs Reviewed  CBC - Abnormal; Notable for the following:    WBC 16.8 (*)    Hemoglobin 11.5 (*)    HCT 33.3 (*)    Platelets 443 (*)    All other components within normal limits  DIFFERENTIAL - Abnormal; Notable for the following:    Lymphocytes Relative 11 (*)    Monocytes Relative 14 (*)    Neutro Abs 12.4 (*)    Monocytes Absolute 2.4 (*)    All other components within normal limits  BASIC METABOLIC PANEL - Abnormal; Notable for the following:    Sodium 132 (*)    Potassium 3.1 (*)    Chloride 95 (*)    Glucose, Bld 108 (*)    Creatinine, Ser 0.40 (*)    GFR calc non Af Amer 88 (*)    All other components within normal limits  CULTURE, BLOOD (ROUTINE X 2)  CULTURE, BLOOD (ROUTINE X 2)  URINALYSIS, ROUTINE W REFLEX MICROSCOPIC   Dg Chest 2 View  09/29/2011  *RADIOLOGY REPORT*  Clinical Data: Cough, wheezing  CHEST - 2 VIEW  Comparison: 06/07/2011  Findings: Patchy opacity in the lateral right mid lung.  Mild medial right basilar opacity.  Focal opacity in the lingula.  Underlying chronic interstitial markings/emphysematous changes.  Cardiomediastinal silhouette is within normal limits.  Degenerative changes of the visualized thoracolumbar spine.  Stable kyphotic deformity with mid thoracic compression fractures.  IMPRESSION: Multifocal patchy opacities, as described above, suspicious for multifocal pneumonia.  Given the focal nature of the lingular opacity, follow-up chest radiographs are suggested to document clearing and exclude underlying mass.  Original Report Authenticated By: Charline Bills, M.D.     1. Community acquired pneumonia   2. Cellulitis       MDM  Dr. Kaylyn Layer accepted to [facility]:pt was stable on  oxygen        Teressa Lower, NP 09/29/11 2314  Teressa Lower, NP 09/29/11 2314

## 2011-09-29 NOTE — ED Notes (Signed)
Family states pt has had a cough off and on x 2-3 years. Worsens at times. Last night had a "bad spell" and began to do the same tonight. Also has redness to left lower extremity. No distress.

## 2011-09-30 ENCOUNTER — Encounter (HOSPITAL_COMMUNITY): Payer: Self-pay | Admitting: *Deleted

## 2011-09-30 DIAGNOSIS — E876 Hypokalemia: Secondary | ICD-10-CM | POA: Diagnosis not present

## 2011-09-30 DIAGNOSIS — J189 Pneumonia, unspecified organism: Secondary | ICD-10-CM | POA: Diagnosis present

## 2011-09-30 DIAGNOSIS — E878 Other disorders of electrolyte and fluid balance, not elsewhere classified: Secondary | ICD-10-CM | POA: Diagnosis present

## 2011-09-30 DIAGNOSIS — E871 Hypo-osmolality and hyponatremia: Secondary | ICD-10-CM | POA: Diagnosis present

## 2011-09-30 DIAGNOSIS — L0390 Cellulitis, unspecified: Secondary | ICD-10-CM | POA: Diagnosis present

## 2011-09-30 LAB — URINALYSIS, ROUTINE W REFLEX MICROSCOPIC
Glucose, UA: NEGATIVE mg/dL
Nitrite: NEGATIVE
Protein, ur: NEGATIVE mg/dL

## 2011-09-30 LAB — BASIC METABOLIC PANEL
BUN: 6 mg/dL (ref 6–23)
CO2: 24 mEq/L (ref 19–32)
Chloride: 94 mEq/L — ABNORMAL LOW (ref 96–112)
Creatinine, Ser: 0.39 mg/dL — ABNORMAL LOW (ref 0.50–1.10)

## 2011-09-30 LAB — CBC
HCT: 29.6 % — ABNORMAL LOW (ref 36.0–46.0)
MCH: 29.4 pg (ref 26.0–34.0)
MCHC: 32.8 g/dL (ref 30.0–36.0)
MCV: 89.7 fL (ref 78.0–100.0)
RDW: 13.2 % (ref 11.5–15.5)

## 2011-09-30 LAB — DIFFERENTIAL
Basophils Absolute: 0 10*3/uL (ref 0.0–0.1)
Basophils Relative: 0 % (ref 0–1)
Eosinophils Relative: 1 % (ref 0–5)
Monocytes Absolute: 2.1 10*3/uL — ABNORMAL HIGH (ref 0.1–1.0)

## 2011-09-30 LAB — CREATININE, URINE, RANDOM: Creatinine, Urine: 32.78 mg/dL

## 2011-09-30 LAB — URINE MICROSCOPIC-ADD ON

## 2011-09-30 MED ORDER — METOPROLOL SUCCINATE ER 50 MG PO TB24
50.0000 mg | ORAL_TABLET | Freq: Every day | ORAL | Status: DC
  Administered 2011-09-30 – 2011-10-02 (×3): 50 mg via ORAL
  Filled 2011-09-30 (×4): qty 1

## 2011-09-30 MED ORDER — AZITHROMYCIN 500 MG PO TABS
500.0000 mg | ORAL_TABLET | Freq: Every day | ORAL | Status: AC
  Administered 2011-09-30: 500 mg via ORAL
  Filled 2011-09-30: qty 1

## 2011-09-30 MED ORDER — SODIUM CHLORIDE 0.9 % IV SOLN
INTRAVENOUS | Status: DC
  Administered 2011-09-30 – 2011-10-02 (×5): via INTRAVENOUS

## 2011-09-30 MED ORDER — LEVOFLOXACIN IN D5W 500 MG/100ML IV SOLN
500.0000 mg | INTRAVENOUS | Status: DC
  Administered 2011-10-02: 500 mg via INTRAVENOUS
  Filled 2011-09-30: qty 100

## 2011-09-30 MED ORDER — ACETAMINOPHEN 325 MG PO TABS: 650.0000 mg | ORAL_TABLET | Freq: Four times a day (QID) | ORAL | Status: DC | PRN

## 2011-09-30 MED ORDER — CITALOPRAM HYDROBROMIDE 40 MG PO TABS
40.0000 mg | ORAL_TABLET | Freq: Every day | ORAL | Status: DC
  Administered 2011-09-30 – 2011-10-01 (×2): 40 mg via ORAL
  Filled 2011-09-30 (×2): qty 1

## 2011-09-30 MED ORDER — AMLODIPINE BESYLATE 5 MG PO TABS
5.0000 mg | ORAL_TABLET | Freq: Every day | ORAL | Status: DC
  Administered 2011-09-30 – 2011-10-03 (×4): 5 mg via ORAL
  Filled 2011-09-30 (×4): qty 1

## 2011-09-30 MED ORDER — DOCUSATE SODIUM 100 MG PO CAPS
100.0000 mg | ORAL_CAPSULE | Freq: Two times a day (BID) | ORAL | Status: DC
  Administered 2011-09-30 – 2011-10-02 (×6): 100 mg via ORAL
  Filled 2011-09-30 (×8): qty 1

## 2011-09-30 MED ORDER — GUAIFENESIN-DM 100-10 MG/5ML PO SYRP
5.0000 mL | ORAL_SOLUTION | ORAL | Status: DC | PRN
  Administered 2011-09-30 – 2011-10-02 (×5): 5 mL via ORAL
  Filled 2011-09-30 (×6): qty 5

## 2011-09-30 MED ORDER — POTASSIUM CHLORIDE CRYS ER 20 MEQ PO TBCR
EXTENDED_RELEASE_TABLET | ORAL | Status: AC
  Filled 2011-09-30: qty 2

## 2011-09-30 MED ORDER — DORZOLAMIDE HCL-TIMOLOL MAL 2-0.5 % OP SOLN: 1.0000 [drp] | Freq: Two times a day (BID) | OPHTHALMIC | Status: DC

## 2011-09-30 MED ORDER — ACETAMINOPHEN 650 MG RE SUPP: 650.0000 mg | Freq: Four times a day (QID) | RECTAL | Status: DC | PRN

## 2011-09-30 MED ORDER — IPRATROPIUM BROMIDE 0.02 % IN SOLN
0.5000 mg | Freq: Four times a day (QID) | RESPIRATORY_TRACT | Status: DC
  Administered 2011-09-30 (×3): 0.5 mg via RESPIRATORY_TRACT
  Filled 2011-09-30 (×4): qty 2.5

## 2011-09-30 MED ORDER — TIMOLOL MALEATE 0.5 % OP SOLN
1.0000 [drp] | Freq: Two times a day (BID) | OPHTHALMIC | Status: DC
  Administered 2011-10-02 – 2011-10-03 (×2): 1 [drp] via OPHTHALMIC
  Filled 2011-09-30: qty 5

## 2011-09-30 MED ORDER — LEVOFLOXACIN IN D5W 500 MG/100ML IV SOLN
500.0000 mg | Freq: Once | INTRAVENOUS | Status: AC
  Administered 2011-09-30: 500 mg via INTRAVENOUS
  Filled 2011-09-30: qty 100

## 2011-09-30 MED ORDER — AZITHROMYCIN 250 MG PO TABS
250.0000 mg | ORAL_TABLET | Freq: Every day | ORAL | Status: DC
  Administered 2011-10-01 – 2011-10-03 (×3): 250 mg via ORAL
  Filled 2011-09-30 (×3): qty 1

## 2011-09-30 MED ORDER — PNEUMOCOCCAL VAC POLYVALENT 25 MCG/0.5ML IJ INJ
0.5000 mL | INJECTION | INTRAMUSCULAR | Status: AC
  Filled 2011-09-30 (×2): qty 0.5

## 2011-09-30 MED ORDER — ONDANSETRON HCL 4 MG/2ML IJ SOLN
4.0000 mg | Freq: Four times a day (QID) | INTRAMUSCULAR | Status: DC | PRN
  Administered 2011-09-30: 4 mg via INTRAVENOUS
  Filled 2011-09-30: qty 2

## 2011-09-30 MED ORDER — BIMATOPROST 0.03 % OP SOLN
1.0000 [drp] | Freq: Every day | OPHTHALMIC | Status: DC
  Administered 2011-09-30 – 2011-10-02 (×3): 1 [drp] via OPHTHALMIC
  Filled 2011-09-30: qty 2.5

## 2011-09-30 MED ORDER — SENNA 8.6 MG PO TABS
1.0000 | ORAL_TABLET | Freq: Two times a day (BID) | ORAL | Status: DC
  Administered 2011-09-30 – 2011-10-02 (×4): 8.6 mg via ORAL
  Filled 2011-09-30 (×8): qty 1

## 2011-09-30 MED ORDER — ALBUTEROL SULFATE (5 MG/ML) 0.5% IN NEBU
2.5000 mg | INHALATION_SOLUTION | Freq: Four times a day (QID) | RESPIRATORY_TRACT | Status: DC
  Administered 2011-09-30 (×3): 2.5 mg via RESPIRATORY_TRACT
  Filled 2011-09-30 (×4): qty 0.5

## 2011-09-30 MED ORDER — HYDROCHLOROTHIAZIDE 12.5 MG PO CAPS
12.5000 mg | ORAL_CAPSULE | Freq: Every day | ORAL | Status: DC
  Administered 2011-09-30 – 2011-10-03 (×4): 12.5 mg via ORAL
  Filled 2011-09-30 (×4): qty 1

## 2011-09-30 MED ORDER — HEPARIN SODIUM (PORCINE) 5000 UNIT/ML IJ SOLN
5000.0000 [IU] | Freq: Three times a day (TID) | INTRAMUSCULAR | Status: DC
  Administered 2011-09-30 – 2011-10-03 (×9): 5000 [IU] via SUBCUTANEOUS
  Filled 2011-09-30 (×12): qty 1

## 2011-09-30 MED ORDER — GABAPENTIN 300 MG PO CAPS
300.0000 mg | ORAL_CAPSULE | Freq: Every day | ORAL | Status: DC
  Administered 2011-09-30 – 2011-10-02 (×3): 300 mg via ORAL
  Filled 2011-09-30 (×4): qty 1

## 2011-09-30 MED ORDER — ONDANSETRON HCL 4 MG PO TABS: 4.0000 mg | ORAL_TABLET | Freq: Four times a day (QID) | ORAL | Status: DC | PRN

## 2011-09-30 MED ORDER — DORZOLAMIDE HCL 2 % OP SOLN
1.0000 [drp] | Freq: Two times a day (BID) | OPHTHALMIC | Status: DC
  Administered 2011-09-30 – 2011-10-03 (×7): 1 [drp] via OPHTHALMIC
  Filled 2011-09-30: qty 10

## 2011-09-30 MED ORDER — ALBUTEROL SULFATE (5 MG/ML) 0.5% IN NEBU
2.5000 mg | INHALATION_SOLUTION | RESPIRATORY_TRACT | Status: DC | PRN
  Administered 2011-09-30: 2.5 mg via RESPIRATORY_TRACT
  Filled 2011-09-30: qty 0.5

## 2011-09-30 MED ORDER — BENZONATATE 100 MG PO CAPS
100.0000 mg | ORAL_CAPSULE | Freq: Three times a day (TID) | ORAL | Status: DC | PRN
  Administered 2011-10-02: 100 mg via ORAL
  Filled 2011-09-30 (×2): qty 1

## 2011-09-30 MED ORDER — ASPIRIN EC 81 MG PO TBEC
81.0000 mg | DELAYED_RELEASE_TABLET | Freq: Every day | ORAL | Status: DC
  Administered 2011-09-30 – 2011-10-03 (×4): 81 mg via ORAL
  Filled 2011-09-30 (×4): qty 1

## 2011-09-30 MED ORDER — POTASSIUM CHLORIDE CRYS ER 20 MEQ PO TBCR
40.0000 meq | EXTENDED_RELEASE_TABLET | Freq: Once | ORAL | Status: AC
  Administered 2011-09-30: 40 meq via ORAL

## 2011-09-30 NOTE — Progress Notes (Signed)
Page to [facility] admissions. Notified of arrival of pt.

## 2011-09-30 NOTE — H&P (Signed)
PCP:   Ginette Otto, MD, MD  Fannie Knee, pulmonary  Chief Complaint:  Cough, SOB  HPI: 89yoF with h/o HTN, known lung nodule, chronic cough and dyspnea, transferred from  [facility] with low grade fever, leukocytosis, multifocal PNA on CXR, and LLE cellulitis.   Review of EPIC and per discussion with pt, she has been seeing Dr. Maple Hudson in pulmonary  for the past year or so with a dry cough for which no clear diagnosis has been given,  but felt to be benign overall, for which she has tried various medications. She is  also followed by PCP Stoneking for lung nodule vs LLL scar/consolidation. She reports  longstanding h/o cough that is usually worse at night, making sleep difficult.   More recently however, in the past couple nights she's had coughing fits so bad that  she has gotten SOB, thus prompting evaluation at [facility] ED. However, she denies fevers,  chills, sweats, or even feeling systemically ill and overall feels well except the  bothersomeness of the cough, which is dry and non-productive. The SOB is only  associated with heavy coughing fits, and she denies other cardiopulmonary symptoms,  no GI symptoms of n/v/d/abd pain. Of note, she also indicates that the erythema on  her LLE around her ankle started yesterday night as well.   While at [facility] ED, Tmax was 100.2, HR max was 106. BP was stable. Lowest O2 recorded  was 91%. Chem panel with Na 132, Cl 95, K 3.1, renal 8/0.4, otherwise stable. WBC  16.8 with 74% neutros, left shift, and elevated monos 14%. BCx pending x2. CXR showed  multifocal patchy opacities in the right lateral mid lung, medial right base, and  focal opacity in the lingula.   ROS as above, otherwise no dysuria, dizziness, LH, and o/w all other systems  negative.   Past Medical History  Diagnosis Date  . Hypertension   . Shortness of breath   . Depression   . GERD (gastroesophageal reflux disease)   . Arthritis   . Anxiety   . Chronic cough    Followed by Fannie Knee   . Abnormal CXR 04/2010    LLL consolidation/scar; also with lung nodules followed by Hal Stoneking  . PUD (peptic ulcer disease)   . Hyperlipidemia   . Glaucoma   . Osteoporosis   . Diastolic dysfunction   . Allergic rhinitis     Past Surgical History  Procedure Date  . Breast surgery     Biopsy 1989  . Eye surgery     cateracts/lazer  . Fracture surgery     toe surgery    Medications:  HOME MEDS:  Reconciled with patient's typed medication list  Prior to Admission medications   Medication Sig Start Date End Date Taking? Authorizing Provider  acetaminophen-codeine (TYLENOL #3) 300-30 MG per tablet Take 1 tablet by mouth every 4 (four) hours as needed. For pain     Yes Historical Provider, MD  albuterol (PROVENTIL HFA;VENTOLIN HFA) 108 (90 BASE) MCG/ACT inhaler Inhale 2 puffs into the lungs every 6 (six) hours as needed. For wheezing    Yes Historical Provider, MD  amLODipine (NORVASC) 5 MG tablet Take 5 mg by mouth daily.     Yes Historical Provider, MD  aspirin 81 MG tablet Take 81 mg by mouth daily.     Yes Historical Provider, MD  bimatoprost (LUMIGAN) 0.03 % ophthalmic solution Apply 1 drop to eye at bedtime.     Yes Historical Provider, MD  calcitonin, salmon, (MIACALCIN/FORTICAL) 200 UNIT/ACT nasal spray Place 1 spray into the nose daily.     Yes Historical Provider, MD  Calcium Carbonate-Vitamin D (CALCIUM 600+D) 600-200 MG-UNIT TABS Take 1 tablet by mouth 2 (two) times daily.     Yes Historical Provider, MD  citalopram (CELEXA) 40 MG tablet Take 40 mg by mouth daily.     Yes Historical Provider, MD  dorzolamide-timolol (COSOPT) 22.3-6.8 MG/ML ophthalmic solution Apply 1 drop to eye 2 (two) times daily.     Yes Historical Provider, MD  fish oil-omega-3 fatty acids 1000 MG capsule Take 2 g by mouth daily.     Yes Historical Provider, MD  gabapentin (NEURONTIN) 100 MG capsule Take 300 mg by mouth at bedtime.     Yes Historical Provider, MD    guaiFENesin-dextromethorphan (ROBITUSSIN DM) 100-10 MG/5ML syrup Take 10 mLs by mouth 3 (three) times daily as needed. For cough     Yes Historical Provider, MD  hydrochlorothiazide (MICROZIDE) 12.5 MG capsule Take 12.5 mg by mouth daily.     Yes Historical Provider, MD  metoprolol (TOPROL-XL) 50 MG 24 hr tablet Take 50 mg by mouth daily.     Yes Historical Provider, MD  Multiple Vitamin (MULITIVITAMIN WITH MINERALS) TABS Take 1 tablet by mouth daily.     Yes Historical Provider, MD    Allergies:  Allergies  Allergen Reactions  . Cefaclor   . Oxycodone Hcl Other (See Comments)    Loss of mental control    Social History: Widowed, with two sons and a daughter in Social worker, lives alone but children are nearby, never smoker, no EtOH. Still gets around with a cane and walker.   reports that she has never smoked. She does not have any smokeless tobacco history on file. She reports that she does not drink alcohol or use illicit drugs.  Family History: Family History  Problem Relation Age of Onset  . Malignant hyperthermia Brother   . Asthma Brother   . Heart disease Brother     One had CABG, one deceased at 70 of MI  . Cancer Brother     One had liver ca from EtOH, one had lung from smoking    Physical Exam: Filed Vitals:   09/29/11 2135 09/29/11 2156 09/30/11 0019 09/30/11 0133  BP: 152/60  110/66 126/69  Pulse: 106  88 82  Temp: 100.2 F (37.9 C)  98.4 F (36.9 C) 98.8 F (37.1 C)  TempSrc: Oral  Oral Oral  Resp: 26  20 18   Height:    4\' 10"  (1.473 m)  Weight:    61.6 kg (135 lb 12.9 oz)  SpO2: 91% 95% 96% 94%   Blood pressure 126/69, pulse 82, temperature 98.8 F (37.1 C), temperature source Oral, resp. rate 18, height 4\' 10"  (1.473 m), weight 61.6 kg (135 lb 12.9 oz), SpO2 94.00%. Gen: Elderly and frail appearing F in no distress, able to relate history, daughter  in law at bedside, coughing dry cough occasionally during interview, no increased WOB  or accessory muscle  use HEENT: PERRL, EOMI, sclera clear, conjunctivae minimally pale but not overt. Mouth  moist and normal appearing Neck: Supple, thin, no LAD Lungs: Poor to fair air movement with R middle field inspiratory crackle not heard at  base or apex, left fairly clear, but difficult to hear given suboptimal air movement Heart: RRR, not tachy, with a slight to mild early systolic murmur Abdomen: Soft, NT ND, no HSM, benign exam Extrem: Warm, well perfused,  thin UE's and normal LE's with soft pitting edema about  and just above the ankles. LLE about the ankle there is a soft pink erythematous  macule circumscribing ankle, poorly defined borders, not particularly warm or tender Neuro: Alert, conversant, pleasant, normal and grossly non-focal, moving extremities  well.   Labs & Imaging Results for orders placed during the hospital encounter of 09/29/11 (from the past 48 hour(s))  CBC     Status: Abnormal   Collection Time   09/29/11 10:04 PM      Component Value Range Comment   WBC 16.8 (*) 4.0 - 10.5 (K/uL)    RBC 3.90  3.87 - 5.11 (MIL/uL)    Hemoglobin 11.5 (*) 12.0 - 15.0 (g/dL)    HCT 78.2 (*) 95.6 - 46.0 (%)    MCV 85.4  78.0 - 100.0 (fL)    MCH 29.5  26.0 - 34.0 (pg)    MCHC 34.5  30.0 - 36.0 (g/dL)    RDW 21.3  08.6 - 57.8 (%)    Platelets 443 (*) 150 - 400 (K/uL)   DIFFERENTIAL     Status: Abnormal   Collection Time   09/29/11 10:04 PM      Component Value Range Comment   Neutrophils Relative 74  43 - 77 (%)    Lymphocytes Relative 11 (*) 12 - 46 (%)    Monocytes Relative 14 (*) 3 - 12 (%)    Eosinophils Relative 1  0 - 5 (%)    Basophils Relative 0  0 - 1 (%)    Neutro Abs 12.4 (*) 1.7 - 7.7 (K/uL)    Lymphs Abs 1.8  0.7 - 4.0 (K/uL)    Monocytes Absolute 2.4 (*) 0.1 - 1.0 (K/uL)    Eosinophils Absolute 0.2  0.0 - 0.7 (K/uL)    Basophils Absolute 0.0  0.0 - 0.1 (K/uL)    WBC Morphology MILD LEFT SHIFT (1-5% METAS, OCC MYELO, OCC BANDS)     BASIC METABOLIC PANEL     Status:  Abnormal   Collection Time   09/29/11 10:04 PM      Component Value Range Comment   Sodium 132 (*) 135 - 145 (mEq/L)    Potassium 3.1 (*) 3.5 - 5.1 (mEq/L)    Chloride 95 (*) 96 - 112 (mEq/L)    CO2 24  19 - 32 (mEq/L)    Glucose, Bld 108 (*) 70 - 99 (mg/dL)    BUN 8  6 - 23 (mg/dL)    Creatinine, Ser 4.69 (*) 0.50 - 1.10 (mg/dL)    Calcium 8.7  8.4 - 10.5 (mg/dL)    GFR calc non Af Amer 88 (*) >90 (mL/min)    GFR calc Af Amer >90  >90 (mL/min)   URINALYSIS, ROUTINE W REFLEX MICROSCOPIC     Status: Abnormal   Collection Time   09/30/11  3:00 AM      Component Value Range Comment   Color, Urine YELLOW  YELLOW     APPearance CLEAR  CLEAR     Specific Gravity, Urine 1.011  1.005 - 1.030     pH 7.0  5.0 - 8.0     Glucose, UA NEGATIVE  NEGATIVE (mg/dL)    Hgb urine dipstick MODERATE (*) NEGATIVE     Bilirubin Urine NEGATIVE  NEGATIVE     Ketones, ur 15 (*) NEGATIVE (mg/dL)    Protein, ur NEGATIVE  NEGATIVE (mg/dL)    Urobilinogen, UA 1.0  0.0 - 1.0 (mg/dL)  Nitrite NEGATIVE  NEGATIVE     Leukocytes, UA MODERATE (*) NEGATIVE    URINE MICROSCOPIC-ADD ON     Status: Abnormal   Collection Time   09/30/11  3:00 AM      Component Value Range Comment   Squamous Epithelial / LPF FEW (*) RARE     WBC, UA 3-6  <3 (WBC/hpf)    RBC / HPF 3-6  <3 (RBC/hpf)    Bacteria, UA RARE  RARE     Dg Chest 2 View  09/29/2011  *RADIOLOGY REPORT*  Clinical Data: Cough, wheezing  CHEST - 2 VIEW  Comparison: 06/07/2011  Findings: Patchy opacity in the lateral right mid lung.  Mild medial right basilar opacity.  Focal opacity in the lingula.  Underlying chronic interstitial markings/emphysematous changes.  Cardiomediastinal silhouette is within normal limits.  Degenerative changes of the visualized thoracolumbar spine.  Stable kyphotic deformity with mid thoracic compression fractures.  IMPRESSION: Multifocal patchy opacities, as described above, suspicious for multifocal pneumonia.  Given the focal nature  of the lingular opacity, follow-up chest radiographs are suggested to document clearing and exclude underlying mass.  Original Report Authenticated By: Charline Bills, M.D.    Impression Present on Admission:  .Pneumonia, community acquired .Hyponatremia .Hypochloremia .Cellulitis .HYPERCHOLESTEROLEMIA .HYPERTENSION .Cough  89yoF with h/o HTN, known lung nodule, chronic cough and dyspnea, transferred from  [facility] with low grade fever, leukocytosis, multifocal PNA on CXR, and LLE cellulitis.  1. Multifocal PNA: Community dwelling elderly pt with no hospital exposure so  reasonable to treat for HCAP. Her PORT score is 82 base entirely on age alone, giving  her 2.8% mortality risk, and short inpatient stay was reasonable admission. Given  concurrent LLE erythema suspicious for cellulitis, considered switching her to  Ceftriaxone to cover skin, but pt with cefaclor allergy noted. Will continue  Levofloxacin for now, and add Azithromycin.  - In the am, would ask abt prior pneumococcal vaccination and give it if hasn't had - Levofloxacin, Azithromyin, f/u BCx's, sputum culture  - IVF's, tylenol for fevers, nebs  2. LLE erythema: Likely a small amount of cellulitis. ABx as above. If not responding  with Levaquin alone, may need to add Vanco or try a PCN/cephalopsporin depending on  severity of noted allergy.    3. SOB/dyspnea/chronic cough: > 39yr duration, followed by Fannie Knee without clear  diagnosis, but felt to be benign persistent. As discussed with pt and daughter, we  can try various symptomatic treatments but unlikely to come up with clear diagnosis  or clear resolution this admission, if anything may get worse with multifocal PNA.   - Will order guaifenesin, tessalon perles, dextromethorphan, scheduled nebs  4. HypoNa, hypoCl: likely a bit hypovolemic, will give IVF's and trend.   5. HTN: Currently stable. Continue home Amlodipine, ASA 81, Toprol, HCTZ 6. Med rec:  Continue eye drops, celexa, holding other non-essential meds   Telemetry bed, team 5 DNR, discussed with pt and her daughter. Has advance directives  Other plans as per orders.  Judit Awad 09/30/2011, 4:01 AM

## 2011-09-30 NOTE — Progress Notes (Signed)
Patient ID: Molly Cortez, female   DOB: 1917/04/21, 77 y.o.   MRN: [id number] Subjective: No events overnight. Patient denies chest pain, shortness of breath, abdominal pain.  Objective:  Vital signs in last 24 hours:  Filed Vitals:   09/30/11 0559 09/30/11 0745 09/30/11 1000 09/30/11 1445  BP: 116/65  122/61   Pulse: 74  85   Temp: 98.4 F (36.9 C)  98.1 F (36.7 C)   TempSrc: Oral  Oral   Resp: 20  16   Height:      Weight:      SpO2: 94% 95% 93% 95%    Intake/Output from previous day:   Intake/Output Summary (Last 24 hours) at 09/30/11 1619 Last data filed at 09/30/11 0900  Gross per 24 hour  Intake    535 ml  Output      1 ml  Net    534 ml    Physical Exam: General: Alert, awake, oriented x3, in no acute distress. HEENT: No bruits, no goiter. Moist mucous membranes, no scleral icterus, no conjunctival pallor. Heart: Regular rate and rhythm, S1/S2 +, no murmurs, rubs, gallops. Lungs: Clear to auscultation bilaterally. No wheezing, no rhonchi, no rales.  Abdomen: Soft, nontender, nondistended, positive bowel sounds. Extremities: No clubbing or cyanosis, no pitting edema,  positive pedal pulses. Neuro: Grossly nonfocal.  Lab Results:  Basic Metabolic Panel:    Component Value Date/Time   NA 127* 09/30/2011 0630   K 2.8* 09/30/2011 0630   CL 94* 09/30/2011 0630   CO2 24 09/30/2011 0630   BUN 6 09/30/2011 0630   CREATININE 0.39* 09/30/2011 0630   GLUCOSE 355* 09/30/2011 0630   CALCIUM 7.9* 09/30/2011 0630   CBC:    Component Value Date/Time   WBC 12.9* 09/30/2011 0630   HGB 9.7* 09/30/2011 0630   HCT 29.6* 09/30/2011 0630   PLT 357 09/30/2011 0630   MCV 89.7 09/30/2011 0630   NEUTROABS 9.4* 09/30/2011 0630   LYMPHSABS 1.2 09/30/2011 0630   MONOABS 2.1* 09/30/2011 0630   EOSABS 0.2 09/30/2011 0630   BASOSABS 0.0 09/30/2011 0630      Lab 09/30/11 0630 09/29/11 2204  WBC 12.9* 16.8*  HGB 9.7* 11.5*  HCT 29.6* 33.3*  PLT 357 443*  MCV 89.7  85.4  MCH 29.4 29.5  MCHC 32.8 34.5  RDW 13.2 12.4  LYMPHSABS 1.2 1.8  MONOABS 2.1* 2.4*  EOSABS 0.2 0.2  BASOSABS 0.0 0.0  BANDABS -- --    Lab 09/30/11 0630 09/29/11 2204  NA 127* 132*  K 2.8* 3.1*  CL 94* 95*  CO2 24 24  GLUCOSE 355* 108*  BUN 6 8  CREATININE 0.39* 0.40*  CALCIUM 7.9* 8.7  MG -- --   No results found for this basename: INR:5,PROTIME:5 in the last 168 hours Cardiac markers: No results found for this basename: CK:3,CKMB:3,TROPONINI:3,MYOGLOBIN:3 in the last 168 hours No components found with this basename: POCBNP:3 No results found for this or any previous visit (from the past 240 hour(s)).  Studies/Results: Dg Chest 2 View  09/29/2011    IMPRESSION: Multifocal patchy opacities, as described above, suspicious for multifocal pneumonia.  Given the focal nature of the lingular opacity, follow-up chest radiographs are suggested to document clearing and exclude underlying mass.   Medications: Scheduled Meds:   . sodium chloride   Intravenous Once  . acetaminophen-codeine  1 tablet Oral Once  . albuterol  2.5 mg Nebulization Q6H  . amLODipine  5 mg Oral Daily  . aspirin EC  81 mg Oral Daily  . azithromycin  500 mg Oral Daily   Followed by  . azithromycin  250 mg Oral Daily  . bimatoprost  1 drop Both Eyes QHS  . citalopram  40 mg Oral Daily  . docusate sodium  100 mg Oral BID  . dorzolamide  1 drop Both Eyes BID  . gabapentin  300 mg Oral QHS  . heparin  5,000 Units Subcutaneous Q8H  . hydrochlorothiazide  12.5 mg Oral Daily  . ipratropium  0.5 mg Nebulization Q6H  . levalbuterol      . levofloxacin (LEVAQUIN) IV  500 mg Intravenous Once  . levofloxacin (LEVAQUIN) IV  500 mg Intravenous Q48H  . metoprolol  50 mg Oral Daily  . moxifloxacin  400 mg Intravenous Once  . pneumococcal 23 valent vaccine  0.5 mL Intramuscular Tomorrow-1000  . potassium chloride SA      . potassium chloride  40 mEq Oral Once  . senna  1 tablet Oral BID  . timolol  1 drop  Both Eyes BID  . DISCONTD: dorzolamide-timolol  1 drop Both Eyes BID  . DISCONTD: levalbuterol  1.25 mg Nebulization Once   Continuous Infusions:   . sodium chloride 100 mL/hr at 09/30/11 0403   PRN Meds:.acetaminophen, acetaminophen, albuterol, benzonatate, guaiFENesin-dextromethorphan, ondansetron (ZOFRAN) IV, ondansetron  Assessment/Plan:  Principal Problem:   *Pneumonia, community acquired - will continue levaquin 500 mg every 48 hours for now and azithromycin; nebulizer treatments;  Active Problems:   Cough - patient reports feeling better today with minimal cough   Hyponatremia - likely secondary to hypovolemia; continue IV fluids for now; obtain urine osmolality, serum osmolality, urine sodium and creatinine and random cortisol level   Cellulitis - antibiotic above will cover for cellulitis   HYPERTENSION - continue current medications   Hypochloremia - remains stable at 94-95; continue to monitor   Hypokalemia - unclear etiology; repleted; follow up the level in am   EDUCATION - test results and diagnostic studies were discussed with patient and pt's family who was present at the bedside - patient and family have verbalized the understanding - questions were answered at the bedside and contact information was provided for additional questions or concerns   LOS: 1 day   Treshaun Carrico 09/30/2011, 4:19 PM  [facility] Pager: [phone]

## 2011-09-30 NOTE — Progress Notes (Signed)
Physical Therapy Evaluation Patient Details Name: Molly Cortez MRN: [id number] DOB: Apr 21, 1917 Today's Date: 09/30/2011  Problem List:  Patient Active Problem List  Diagnoses  . HYPERCHOLESTEROLEMIA  . HYPERLIPIDEMIA  . GLAUCOMA  . HYPERTENSION  . ALLERGIC RHINITIS  . LUNG NODULE  . PEPTIC ULCER DISEASE  . HIATAL HERNIA  . DEGENERATIVE JOINT DISEASE  . DYSPNEA ON EXERTION  . Cough  . Pneumonia, community acquired  . Hyponatremia  . Hypochloremia  . Cellulitis    Past Medical History:  Past Medical History  Diagnosis Date  . Hypertension   . Shortness of breath   . Depression   . GERD (gastroesophageal reflux disease)   . Arthritis   . Anxiety   . Chronic cough     Followed by Fannie Knee   . Abnormal CXR 04/2010    LLL consolidation/scar; also with lung nodules followed by Hal Stoneking  . PUD (peptic ulcer disease)   . Hyperlipidemia   . Glaucoma   . Osteoporosis   . Diastolic dysfunction   . Allergic rhinitis    Past Surgical History:  Past Surgical History  Procedure Date  . Breast surgery     Biopsy 1989  . Eye surgery     cateracts/lazer  . Fracture surgery     toe surgery    PT Assessment/Plan/Recommendation PT Assessment Clinical Impression Statement: Pt is doing well with mobility.  Acute PT will follow pt to progress activiity tolerance.  Pt's O2 sats dropped slightly during gait training on room air but pt recovered quickly  PT Recommendation/Assessment: Patient will need skilled PT in the acute care venue PT Problem List: Decreased activity tolerance Barriers to Discharge: None PT Therapy Diagnosis : Generalized weakness;Other (comment) (deconditioning) PT Plan PT Frequency: Min 3X/week PT Treatment/Interventions: Gait training;Functional mobility training;Stair training;Therapeutic activities;Therapeutic exercise;Patient/family education;DME instruction PT Recommendation Follow Up Recommendations: Other (comment) (to be determined  depending on progress.  ) Equipment Recommended: None recommended by PT PT Goals  Acute Rehab PT Goals PT Goal Formulation: With patient/family Time For Goal Achievement: 7 days Pt will Transfer Bed to Chair/Chair to Bed: with modified independence PT Transfer Goal: Bed to Chair/Chair to Bed - Progress: Other (comment) Pt will Ambulate: 51 - 150 feet;with least restrictive assistive device PT Goal: Ambulate - Progress: Other (comment) Pt will Go Up / Down Stairs: with supervision;with cane;with rail(s) PT Goal: Up/Down Stairs - Progress: Other (comment)  PT Evaluation Precautions/Restrictions  Precautions Precautions: Fall Required Braces or Orthoses: Yes Other Brace/Splint: knee brace on L LE (for comfort) Restrictions Weight Bearing Restrictions: No Prior Functioning  Home Living Lives With: Family Receives Help From: Family Type of Home: House Home Layout: One level Bathroom Shower/Tub: Walk-in shower;Curtain Bathroom Toilet: Handicapped height Bathroom Accessibility: No Home Adaptive Equipment: Walker - rolling;Bedside commode/3-in-1;Grab bars in shower;Shower chair with back Prior Function Level of Independence: Independent with basic ADLs;Independent with homemaking with ambulation;Independent with gait;Independent with transfers;Requires assistive device for independence Able to Take Stairs?: Yes Driving: No Vocation: Retired Leisure: Hobbies-no Cognition Cognition Arousal/Alertness: Awake/alert Overall Cognitive Status: Appears within functional limits for tasks assessed Sensation/Coordination Sensation Light Touch: Appears Intact Stereognosis: Not tested Hot/Cold: Not tested Proprioception: Appears Intact Coordination Gross Motor Movements are Fluid and Coordinated: Yes Fine Motor Movements are Fluid and Coordinated: Not tested Extremity Assessment RUE Assessment RUE Assessment: Within Functional Limits LUE Assessment LUE Assessment: Within Functional  Limits RLE Assessment RLE Assessment: Within Functional Limits LLE Assessment LLE Assessment: Within Functional Limits Mobility (including Balance) Bed  Mobility Bed Mobility: Yes Supine to Sit: 6: Modified independent (Device/Increase time);HOB flat Transfers Transfers: Yes Sit to Stand: 5: Supervision;From bed Sit to Stand Details (indicate cue type and reason): verbal cues for hand placement Stand to Sit: 5: Supervision;To chair/3-in-1 Stand to Sit Details: verbal cues for hand placement.  Ambulation/Gait Ambulation/Gait: Yes Ambulation/Gait Assistance: 5: Supervision Ambulation/Gait Assistance Details (indicate cue type and reason): verbal cues to stay within walker.   Ambulation Distance (Feet): 75 Feet Assistive device: Rolling walker Gait Pattern: Within Functional Limits Gait velocity: WFL Stairs: No Wheelchair Mobility Wheelchair Mobility: No  Posture/Postural Control Posture/Postural Control: No significant limitations Balance Balance Assessed: No Exercise    End of Session PT - End of Session Equipment Utilized During Treatment: Gait belt Activity Tolerance: Patient limited by fatigue (c/o sob after ambulating but not during.  ) Patient left: in chair;with family/visitor present;with call bell in reach Nurse Communication: Mobility status for transfers;Mobility status for ambulation General Behavior During Session: [facility] for tasks performed Cognition: [facility] for tasks performed  Jonatan Wilsey L. Roshad Hack DPT [phone] Alferd Apa 09/30/2011, 3:55 PM

## 2011-09-30 NOTE — Progress Notes (Signed)
Utilization Review Completed.Orvie Caradine T12/17/2012   

## 2011-09-30 NOTE — ED Notes (Signed)
Pt transported to [facility] by [facility]- report given to [facility], [employer]

## 2011-10-01 LAB — CBC
MCH: 29.5 pg (ref 26.0–34.0)
MCV: 88.8 fL (ref 78.0–100.0)
Platelets: 357 10*3/uL (ref 150–400)
RBC: 3.39 MIL/uL — ABNORMAL LOW (ref 3.87–5.11)
RDW: 13.1 % (ref 11.5–15.5)

## 2011-10-01 LAB — BASIC METABOLIC PANEL
CO2: 25 mEq/L (ref 19–32)
Calcium: 8 mg/dL — ABNORMAL LOW (ref 8.4–10.5)
Creatinine, Ser: 0.37 mg/dL — ABNORMAL LOW (ref 0.50–1.10)

## 2011-10-01 MED ORDER — ALBUTEROL SULFATE (5 MG/ML) 0.5% IN NEBU: 2.5000 mg | INHALATION_SOLUTION | Freq: Four times a day (QID) | RESPIRATORY_TRACT | Status: DC | PRN

## 2011-10-01 MED ORDER — CITALOPRAM HYDROBROMIDE 20 MG PO TABS
20.0000 mg | ORAL_TABLET | Freq: Every day | ORAL | Status: DC
  Administered 2011-10-02 – 2011-10-03 (×2): 20 mg via ORAL
  Filled 2011-10-01 (×2): qty 1

## 2011-10-01 MED ORDER — POTASSIUM CHLORIDE CRYS ER 20 MEQ PO TBCR
40.0000 meq | EXTENDED_RELEASE_TABLET | Freq: Once | ORAL | Status: AC
  Administered 2011-10-01: 40 meq via ORAL
  Filled 2011-10-01: qty 2

## 2011-10-01 NOTE — Progress Notes (Signed)
Patient ID: Molly Cortez, female   DOB: 07-17-1917, 77 y.o.   MRN: [id number] Subjective: No events overnight. Patient denies chest pain, shortness of breath, abdominal pain.  Objective:  Vital signs in last 24 hours:  Filed Vitals:   09/30/11 2223 10/01/11 1000 10/01/11 1015 10/01/11 1400  BP: 128/65 102/58 110/50 115/61  Pulse: 79 81 72 80  Temp: 97.8 F (36.6 C) 98.4 F (36.9 C)  97.9 F (36.6 C)  TempSrc: Oral Oral  Oral  Resp: 20 18  18   Height:      Weight: 61.6 kg (135 lb 12.9 oz)     SpO2: 91% 92%  97%    Intake/Output from previous day:   Intake/Output Summary (Last 24 hours) at 10/01/11 1757 Last data filed at 10/01/11 1700  Gross per 24 hour  Intake    480 ml  Output   1202 ml  Net   -722 ml    Physical Exam: General: Alert, awake, oriented x3, in no acute distress. HEENT: No bruits, no goiter. Moist mucous membranes, no scleral icterus, no conjunctival pallor. Heart: Regular rate and rhythm, S1/S2 +, no murmurs, rubs, gallops. Lungs: Clear to auscultation bilaterally. No wheezing, no rhonchi, no rales.  Abdomen: Soft, nontender, nondistended, positive bowel sounds. Extremities: No clubbing or cyanosis, no pitting edema,  positive pedal pulses. Neuro: Grossly nonfocal.  Lab Results:  Basic Metabolic Panel:    Component Value Date/Time   NA 137 10/01/2011 0535   K 3.0* 10/01/2011 0535   CL 104 10/01/2011 0535   CO2 25 10/01/2011 0535   BUN 4* 10/01/2011 0535   CREATININE 0.37* 10/01/2011 0535   GLUCOSE 84 10/01/2011 0535   CALCIUM 8.0* 10/01/2011 0535   CBC:    Component Value Date/Time   WBC 11.1* 10/01/2011 0535   HGB 10.0* 10/01/2011 0535   HCT 30.1* 10/01/2011 0535   PLT 357 10/01/2011 0535   MCV 88.8 10/01/2011 0535   NEUTROABS 9.4* 09/30/2011 0630   LYMPHSABS 1.2 09/30/2011 0630   MONOABS 2.1* 09/30/2011 0630   EOSABS 0.2 09/30/2011 0630   BASOSABS 0.0 09/30/2011 0630      Lab 10/01/11 0535 09/30/11 0630 09/29/11 2204  WBC  11.1* 12.9* 16.8*  HGB 10.0* 9.7* 11.5*  HCT 30.1* 29.6* 33.3*  PLT 357 357 443*  MCV 88.8 89.7 85.4  MCH 29.5 29.4 29.5  MCHC 33.2 32.8 34.5  RDW 13.1 13.2 12.4  LYMPHSABS -- 1.2 1.8  MONOABS -- 2.1* 2.4*  EOSABS -- 0.2 0.2  BASOSABS -- 0.0 0.0  BANDABS -- -- --    Lab 10/01/11 0535 09/30/11 0630 09/29/11 2204  NA 137 127* 132*  K 3.0* 2.8* 3.1*  CL 104 94* 95*  CO2 25 24 24   GLUCOSE 84 355* 108*  BUN 4* 6 8  CREATININE 0.37* 0.39* 0.40*  CALCIUM 8.0* 7.9* 8.7  MG -- -- --   No results found for this basename: INR:5,PROTIME:5 in the last 168 hours Cardiac markers: No results found for this basename: CK:3,CKMB:3,TROPONINI:3,MYOGLOBIN:3 in the last 168 hours No components found with this basename: POCBNP:3 Recent Results (from the past 240 hour(s))  CULTURE, BLOOD (ROUTINE X 2)     Status: Normal (Preliminary result)   Collection Time   09/29/11 10:10 PM      Component Value Range Status Comment   Specimen Description BLOOD RIGHT HAND   Final    Special Requests BOTTLES DRAWN AEROBIC AND ANAEROBIC 5cc   Final    Setup Time  [id number]   Final    Culture     Final    Value:        BLOOD CULTURE RECEIVED NO GROWTH TO DATE CULTURE WILL BE HELD FOR 5 DAYS BEFORE ISSUING A FINAL NEGATIVE REPORT   Report Status PENDING   Incomplete   CULTURE, BLOOD (ROUTINE X 2)     Status: Normal (Preliminary result)   Collection Time   09/29/11 10:25 PM      Component Value Range Status Comment   Specimen Description BLOOD LEFT HAND   Final    Special Requests BOTTLES DRAWN AEROBIC AND ANAEROBIC 5cc   Final    Setup Time [id number]   Final    Culture     Final    Value:        BLOOD CULTURE RECEIVED NO GROWTH TO DATE CULTURE WILL BE HELD FOR 5 DAYS BEFORE ISSUING A FINAL NEGATIVE REPORT   Report Status PENDING   Incomplete     Studies/Results: Dg Chest 2 View  09/29/2011 IMPRESSION: Multifocal patchy opacities, as described above, suspicious for multifocal pneumonia.  Given the  focal nature of the lingular opacity, follow-up chest radiographs are suggested to document clearing and exclude underlying mass.    Medications: Scheduled Meds:   . amLODipine  5 mg Oral Daily  . aspirin EC  81 mg Oral Daily  . azithromycin  250 mg Oral Daily  . bimatoprost  1 drop Both Eyes QHS  . citalopram  40 mg Oral Daily  . docusate sodium  100 mg Oral BID  . dorzolamide  1 drop Both Eyes BID  . gabapentin  300 mg Oral QHS  . heparin  5,000 Units Subcutaneous Q8H  . hydrochlorothiazide  12.5 mg Oral Daily  . levofloxacin (LEVAQUIN) IV  500 mg Intravenous Q48H  . metoprolol  50 mg Oral Daily  . pneumococcal 23 valent vaccine  0.5 mL Intramuscular Tomorrow-1000  . potassium chloride SA      . potassium chloride  40 mEq Oral Once  . senna  1 tablet Oral BID  . timolol  1 drop Both Eyes BID  . DISCONTD: albuterol  2.5 mg Nebulization Q6H  . DISCONTD: ipratropium  0.5 mg Nebulization Q6H   Continuous Infusions:   . sodium chloride 100 mL/hr at 10/01/11 1048   PRN Meds:.acetaminophen, acetaminophen, albuterol, benzonatate, guaiFENesin-dextromethorphan, ondansetron (ZOFRAN) IV, ondansetron, DISCONTD: albuterol  Assessment/Plan:   Principal Problem:   *Pneumonia, community acquired - will continue levaquin 500 mg every 48 hours for now and azithromycin; nebulizer treatments  Active Problems:    Cough - patient reports feeling betterwith minimal cough    Hyponatremia - likely secondary to hypovolemia; continue IV fluids for now; obtain urine osmolality, serum osmolality, urine sodium and creatinine and random cortisol level    Cellulitis - antibiotic above will cover for cellulitis    HYPERTENSION - continue current medications    Hypokalemia - unclear etiology; repleted; follow up the level in am    EDUCATION  - test results and diagnostic studies were discussed with patient and pt's family who was present at the bedside  - patient and family have verbalized  the understanding  - questions were answered at the bedside and contact information was provided for additional questions or concerns       LOS: 2 days   Jamareon Shimel 10/01/2011, 5:57 PM  [facility] Pager: [phone]

## 2011-10-01 NOTE — Plan of Care (Signed)
Problem: Phase I Progression Outcomes Goal: Dyspnea controlled at rest Outcome: Completed/Met Date Met:  10/01/11 Pt not experiencing dyspnea at rest

## 2011-10-02 LAB — CBC
Hemoglobin: 10.3 g/dL — ABNORMAL LOW (ref 12.0–15.0)
MCH: 29.1 pg (ref 26.0–34.0)
MCV: 88.7 fL (ref 78.0–100.0)
Platelets: 384 10*3/uL (ref 150–400)
RBC: 3.54 MIL/uL — ABNORMAL LOW (ref 3.87–5.11)

## 2011-10-02 LAB — BASIC METABOLIC PANEL
BUN: 3 mg/dL — ABNORMAL LOW (ref 6–23)
CO2: 27 mEq/L (ref 19–32)
Calcium: 8.1 mg/dL — ABNORMAL LOW (ref 8.4–10.5)
Creatinine, Ser: 0.39 mg/dL — ABNORMAL LOW (ref 0.50–1.10)
Glucose, Bld: 93 mg/dL (ref 70–99)

## 2011-10-02 MED ORDER — POTASSIUM CHLORIDE CRYS ER 20 MEQ PO TBCR
EXTENDED_RELEASE_TABLET | ORAL | Status: AC
  Filled 2011-10-02: qty 2

## 2011-10-02 MED ORDER — ACETAMINOPHEN-CODEINE #3 300-30 MG PO TABS
1.0000 | ORAL_TABLET | ORAL | Status: DC | PRN
  Administered 2011-10-02 – 2011-10-03 (×2): 1 via ORAL
  Filled 2011-10-02 (×2): qty 1

## 2011-10-02 MED ORDER — POTASSIUM CHLORIDE CRYS ER 20 MEQ PO TBCR
40.0000 meq | EXTENDED_RELEASE_TABLET | Freq: Once | ORAL | Status: AC
  Administered 2011-10-02: 40 meq via ORAL

## 2011-10-02 NOTE — Progress Notes (Addendum)
Patient ID: Molly Cortez, female   DOB: [birth year], 77 y.o.   MRN: [id number] Subjective: No events overnight. Breathing better, pain in left lateral chest wall with cough sometimes,   Objective:  Vital signs in last 24 hours:  Filed Vitals:   10/01/11 1700 10/01/11 2140 10/02/11 0609 10/02/11 0945  BP: 100/55 154/71 124/57 102/61  Pulse: 84 89 78 81  Temp: 97.3 F (36.3 C) 97.2 F (36.2 C) 98.5 F (36.9 C) 98.1 F (36.7 C)  TempSrc: Oral Oral Oral Oral  Resp: 18 19 19 20   Height:      Weight:      SpO2: 93% 95% 95% 96%    Intake/Output from previous day:   Intake/Output Summary (Last 24 hours) at 10/02/11 1514 Last data filed at 10/02/11 4696  Gross per 24 hour  Intake 1963.33 ml  Output    900 ml  Net 1063.33 ml    Physical Exam: General: Alert, awake, oriented x3, in no acute distress. HEENT: No bruits, no goiter. Moist mucous membranes, no scleral icterus, no conjunctival pallor. Heart: Regular rate and rhythm, S1/S2 +, no murmurs, rubs, gallops. Lungs: Clear to auscultation bilaterally. No wheezing, no rhonchi, no rales.  Abdomen: Soft, nontender, nondistended, positive bowel sounds. Extremities: No clubbing or cyanosis, no pitting edema,  positive pedal pulses. Neuro: Grossly nonfocal.  Lab Results:  Basic Metabolic Panel:    Component Value Date/Time   NA 137 10/02/2011 0610   K 3.1* 10/02/2011 0610   CL 103 10/02/2011 0610   CO2 27 10/02/2011 0610   BUN 3* 10/02/2011 0610   CREATININE 0.39* 10/02/2011 0610   GLUCOSE 93 10/02/2011 0610   CALCIUM 8.1* 10/02/2011 0610   CBC:    Component Value Date/Time   WBC 10.9* 10/02/2011 0610   HGB 10.3* 10/02/2011 0610   HCT 31.4* 10/02/2011 0610   PLT 384 10/02/2011 0610   MCV 88.7 10/02/2011 0610   NEUTROABS 9.4* 09/30/2011 0630   LYMPHSABS 1.2 09/30/2011 0630   MONOABS 2.1* 09/30/2011 0630   EOSABS 0.2 09/30/2011 0630   BASOSABS 0.0 09/30/2011 0630      Lab 10/02/11 0610 10/01/11 0535 09/30/11  0630 09/29/11 2204  WBC 10.9* 11.1* 12.9* 16.8*  HGB 10.3* 10.0* 9.7* 11.5*  HCT 31.4* 30.1* 29.6* 33.3*  PLT 384 357 357 443*  MCV 88.7 88.8 89.7 85.4  MCH 29.1 29.5 29.4 29.5  MCHC 32.8 33.2 32.8 34.5  RDW 13.0 13.1 13.2 12.4  LYMPHSABS -- -- 1.2 1.8  MONOABS -- -- 2.1* 2.4*  EOSABS -- -- 0.2 0.2  BASOSABS -- -- 0.0 0.0  BANDABS -- -- -- --    Lab 10/02/11 0610 10/01/11 0535 09/30/11 0630 09/29/11 2204  NA 137 137 127* 132*  K 3.1* 3.0* 2.8* 3.1*  CL 103 104 94* 95*  CO2 27 25 24 24   GLUCOSE 93 84 355* 108*  BUN 3* 4* 6 8  CREATININE 0.39* 0.37* 0.39* 0.40*  CALCIUM 8.1* 8.0* 7.9* 8.7  MG -- -- -- --   No results found for this basename: INR:5,PROTIME:5 in the last 168 hours Cardiac markers: No results found for this basename: CK:3,CKMB:3,TROPONINI:3,MYOGLOBIN:3 in the last 168 hours No components found with this basename: POCBNP:3 Recent Results (from the past 240 hour(s))  CULTURE, BLOOD (ROUTINE X 2)     Status: Normal (Preliminary result)   Collection Time   09/29/11 10:10 PM      Component Value Range Status Comment   Specimen Description BLOOD RIGHT  HAND   Final    Special Requests BOTTLES DRAWN AEROBIC AND ANAEROBIC 5cc   Final    Setup Time [id number]   Final    Culture     Final    Value:        BLOOD CULTURE RECEIVED NO GROWTH TO DATE CULTURE WILL BE HELD FOR 5 DAYS BEFORE ISSUING A FINAL NEGATIVE REPORT   Report Status PENDING   Incomplete   CULTURE, BLOOD (ROUTINE X 2)     Status: Normal (Preliminary result)   Collection Time   09/29/11 10:25 PM      Component Value Range Status Comment   Specimen Description BLOOD LEFT HAND   Final    Special Requests BOTTLES DRAWN AEROBIC AND ANAEROBIC 5cc   Final    Setup Time [id number]   Final    Culture     Final    Value:        BLOOD CULTURE RECEIVED NO GROWTH TO DATE CULTURE WILL BE HELD FOR 5 DAYS BEFORE ISSUING A FINAL NEGATIVE REPORT   Report Status PENDING   Incomplete     Studies/Results: Dg Chest  2 View  09/29/2011 IMPRESSION: Multifocal patchy opacities, as described above, suspicious for multifocal pneumonia.  Given the focal nature of the lingular opacity, follow-up chest radiographs are suggested to document clearing and exclude underlying mass.    Medications: Scheduled Meds:    . amLODipine  5 mg Oral Daily  . aspirin EC  81 mg Oral Daily  . azithromycin  250 mg Oral Daily  . bimatoprost  1 drop Both Eyes QHS  . citalopram  20 mg Oral Daily  . docusate sodium  100 mg Oral BID  . dorzolamide  1 drop Both Eyes BID  . gabapentin  300 mg Oral QHS  . heparin  5,000 Units Subcutaneous Q8H  . hydrochlorothiazide  12.5 mg Oral Daily  . levofloxacin (LEVAQUIN) IV  500 mg Intravenous Q48H  . metoprolol  50 mg Oral Daily  . pneumococcal 23 valent vaccine  0.5 mL Intramuscular Tomorrow-1000  . potassium chloride SA      . potassium chloride  40 mEq Oral Once  . potassium chloride  40 mEq Oral Once  . senna  1 tablet Oral BID  . timolol  1 drop Both Eyes BID  . DISCONTD: citalopram  40 mg Oral Daily   Continuous Infusions:    . sodium chloride 100 mL/hr at 10/02/11 0625   PRN Meds:.acetaminophen, acetaminophen, albuterol, benzonatate, guaiFENesin-dextromethorphan, ondansetron (ZOFRAN) IV, ondansetron  Assessment/Plan:    *Pneumonia, community acquired - will continue levaquin 500 mg every 48 hours for now and azithromycin; nebulizer treatments, wean Oxygen, ambulate in halls, check ambulatory sats   Cellulitis - antibiotic above will cover for cellulitis    HYPERTENSION - continue current medications    Hypokalemia - likely related to HCTZ, replaced     Disposition: home tomorrow, will likely need some HH       LOS: 3 days   Ranell Finelli 10/02/2011, 3:14 PM  [facility] Pager: [phone]

## 2011-10-02 NOTE — Progress Notes (Signed)
Physical Therapy Treatment Patient Details Name: Molly Cortez MRN: [id number] DOB: 13-May-1917 Today's Date: 10/02/2011  PT Assessment/Plan  PT - Assessment/Plan Comments on Treatment Session: Pt mobility much impoved. Activity tolerance without oxygen continues to hinder pt.  Suggested to pt and nursing that pt no longer transfer to bedside commode but ambulate to the bathroom to improve pt activity tolerance.   PT Plan: Discharge plan remains appropriate;Frequency remains appropriate PT Frequency: Min 3X/week Follow Up Recommendations: Home health PT Equipment Recommended: None recommended by PT PT Goals  Acute Rehab PT Goals PT Goal Formulation: With patient Time For Goal Achievement: 7 days Pt will Transfer Bed to Chair/Chair to Bed: with modified independence PT Transfer Goal: Bed to Chair/Chair to Bed - Progress: Met Pt will Ambulate: 51 - 150 feet;with least restrictive assistive device PT Goal: Ambulate - Progress: Progressing toward goal Pt will Go Up / Down Stairs: with supervision;with cane;with rail(s) PT Goal: Up/Down Stairs - Progress: Other (comment) (not addressed this session)  PT Treatment Precautions/Restrictions  Precautions Precautions: Fall Required Braces or Orthoses: Yes Other Brace/Splint: knee brace on L LE Restrictions Weight Bearing Restrictions: No Mobility (including Balance) Bed Mobility Bed Mobility: No Transfers Transfers: Yes Sit to Stand: 6: Modified independent (Device/Increase time);From chair/3-in-1;With armrests;With upper extremity assist (2 trials) Sit to Stand Details (indicate cue type and reason): No assistance required pt demonstrates safe technique Stand to Sit: 6: Modified independent (Device/Increase time);With upper extremity assist;With armrests;To chair/3-in-1 (2 trials) Stand to Sit Details: No assistance required pt demonstrates safe technique Ambulation/Gait Ambulation/Gait: Yes Ambulation/Gait Assistance: 6: Modified  independent (Device/Increase time) Ambulation/Gait Assistance Details (indicate cue type and reason): No assistance required pt demonstrates safe technique.  Pt c/o sob on room air O2 sats dropped to 82 on room air.   Ambulation Distance (Feet): 70 Feet Assistive device: Rolling walker Gait Pattern: Within Functional Limits Gait velocity: WFL Stairs: No Wheelchair Mobility Wheelchair Mobility: No  Posture/Postural Control Posture/Postural Control: No significant limitations Balance Balance Assessed: No Exercise    End of Session PT - End of Session Equipment Utilized During Treatment: Gait belt Activity Tolerance: Patient limited by fatigue Patient left: in chair;with call bell in reach Nurse Communication: Mobility status for transfers;Mobility status for ambulation (pt to ambulate to bathroom vs 3 in 1.  ) General Behavior During Session: [facility] for tasks performed Cognition: [facility] for tasks performed  Georgeanne Frankland L. Brandy Kabat DPT [phone] Alferd Apa 10/02/2011, 11:42 AM

## 2011-10-02 NOTE — ED Provider Notes (Signed)
Medical screening examination/treatment/procedure(s) were performed by non-physician practitioner and as supervising physician I was immediately available for consultation/collaboration.  Maleik Vanderzee T Anesha Hackert, MD 10/02/11 1111 

## 2011-10-03 LAB — BASIC METABOLIC PANEL
Calcium: 8.8 mg/dL (ref 8.4–10.5)
Creatinine, Ser: 0.44 mg/dL — ABNORMAL LOW (ref 0.50–1.10)
GFR calc Af Amer: >90 mL/min (ref >90)

## 2011-10-03 LAB — CBC
MCH: 29.3 pg (ref 26.0–34.0)
MCHC: 33.1 g/dL (ref 30.0–36.0)
MCV: 88.5 fL (ref 78.0–100.0)
Platelets: 390 10*3/uL (ref 150–400)
RDW: 13 % (ref 11.5–15.5)

## 2011-10-03 MED ORDER — LEVOFLOXACIN 500 MG PO TABS: 500.0000 mg | ORAL_TABLET | Freq: Every day | ORAL | Status: AC

## 2011-10-03 MED ORDER — BENZONATATE 100 MG PO CAPS: 100.0000 mg | ORAL_CAPSULE | Freq: Three times a day (TID) | ORAL | Status: AC | PRN

## 2011-10-03 NOTE — Progress Notes (Signed)
   CARE MANAGEMENT NOTE 10/03/2011  Patient:  Molly Cortez, Molly Cortez   Account Number:  [id number]  Date Initiated:  10/02/2011  Documentation initiated by:  MAYO,HENRIETTA  Subjective/Objective Assessment:   77 yr-old female adm with dx of PNA; lives alone, has rolling walker, 3-N-1, shower chair.     Action/Plan:   Anticipated DC Date:  10/04/2011   Anticipated DC Plan:  HOME W HOME HEALTH SERVICES      DC Planning Services  CM consult      Choice offered to / List presented to:  C-1 Patient        HH arranged  HH-1 RN  HH-2 PT      [facility] agency  [facility]   Status of service:  Completed, signed off Medicare Important Message given?   (If response is "NO", the following Medicare IM given date fields will be blank) Date Medicare IM given:   Date Additional Medicare IM given:    Discharge Disposition:  HOME W HOME HEALTH SERVICES  Per UR Regulation:  Reviewed for med. necessity/level of care/duration of stay  Comments:  PCP:  Dr. Merlene Laughter  Contact:  Molly Cortez, daughter-in-law  [phone], [phone]  12/20 alerted Molly tayor w [facility], did not need home o2 sats >90%, Molly Daylee Delahoz rn,bsn 10/02/11 1500 Verdis Prime RN MSN CCM Pt lives next door to son and daughter-in-law, family assists with IADLs.  Pt will need home health RN to assess/monitor heart/lung sounds, VS, O2 sats.  Provided agency list, referral made after pt and daughter-in-law discussed and chose agency.  Will check need for home O2 tomorrow.

## 2011-10-03 NOTE — Progress Notes (Signed)
   CARE MANAGEMENT NOTE 10/03/2011  Patient:  Molly Cortez, Molly Cortez   Account Number:  [id number]  Date Initiated:  10/02/2011  Documentation initiated by:  Molly Cortez  Subjective/Objective Assessment:   77 yr-old female adm with dx of PNA; lives alone, has rolling walker, 3-N-1, shower chair.     Anticipated DC Date:  10/04/2011   Anticipated DC Plan:  HOME W HOME HEALTH SERVICES      DC Planning Services  CM consult      Choice offered to / List presented to:  C-1 Patient        HH arranged  HH-1 RN      [facility] agency  [facility]   Status of service:  Completed, signed off  Discharge Disposition:  HOME W HOME HEALTH SERVICES  Per UR Regulation:  Reviewed for med. necessity/level of care/duration of stay  Comments:  PCP:  Dr. Merlene Laughter  Contact:  Molly Cortez, daughter-in-law  [phone], [phone]  12/20 alerted Molly Cortez w [facility] of dis[facility], did not need home o2 sats >90%, Molly dowell rn,bsn 10/02/11 1500 Molly Prime RN MSN CCM Pt lives next door to son and daughter-in-law, family assists with IADLs.  Pt will need home health RN to assess/monitor heart/lung sounds, VS, O2 sats.  Provided agency list, referral made after pt and daughter-in-law discussed and chose agency, list placed in chart.  Will check need for home O2 tomorrow.   HOME HEALTH AGENCIES SERVING [place]   Agencies that are Medicare-Certified and are affiliated with [facility]  Telephone Number Address  [facility]   [facility] has ownership interest in this company; however, you are under no obligation to use this agency. [phone] or  [phone] [address] [place], [place] [address]   Agencies that are Medicare-Certified and are not affiliated with [facility] Agency Telephone Number Address    [facility] [phone] Fax [phone] [address] [place], [place]  [address]  [facility] [phone] or [phone] Fax [phone] [address] [place], [place] [address]  [facility] [phone] Fax [phone] [address] [place], [place] [address]  [facility] [phone] Fax [phone] [address] [place], [place]  [address]  [facility] [phone] Fax [phone] [address], [address] [place], [place] [address]  [facility] [phone] [address] [place], [place] [address]  [facility] [phone]  [address] [place], [place] [address]  [facility] [phone] or [phone] Fax [phone] [address] [place], [place]  [address]  [facility] [phone] Fax [phone] [address] [place], [place]  [address]  [facility]  [phone] Fax [phone] [address] [place], [place] [address]

## 2011-10-06 LAB — CULTURE, BLOOD (ROUTINE X 2)
Culture  Setup Time: 201212170225
Culture  Setup Time: 201212170225
Culture: NO GROWTH

## 2011-10-13 NOTE — Discharge Summary (Signed)
Physician Discharge Summary  Patient ID: Molly Cortez MRN: [id number] DOB/AGE: Nov 27, 1916 77 y.o.  Admit date: 09/29/2011 Discharge date: 10/13/2011  Primary Care Physician:  Ginette Otto, MD, MD  Discharge Diagnoses:   1. Pneumonia, community acquired 2. HYPERCHOLESTEROLEMIA 3. HYPERTENSION 4. Chronic Cough 5. Hyponatremia 6. Hypochloremia 7. Cellulitis 8. Hypokalemia  Discharge Medication List as of 10/03/2011 12:46 PM    START taking these medications   Details  benzonatate (TESSALON) 100 MG capsule Take 1 capsule (100 mg total) by mouth 3 (three) times daily as needed for cough., Starting 10/03/2011, Until Thu 10/10/11, Print    levofloxacin (LEVAQUIN) 500 MG tablet Take 1 tablet (500 mg total) by mouth daily., Starting 10/03/2011, Until Wed 10/09/11, Print      CONTINUE these medications which have NOT CHANGED   Details  acetaminophen-codeine (TYLENOL #3) 300-30 MG per tablet Take 1 tablet by mouth every 4 (four) hours as needed. For pain  , Until Discontinued, Historical Med    albuterol (PROVENTIL HFA;VENTOLIN HFA) 108 (90 BASE) MCG/ACT inhaler Inhale 2 puffs into the lungs every 6 (six) hours as needed. For wheezing , Until Discontinued, Historical Med    amLODipine (NORVASC) 5 MG tablet Take 5 mg by mouth daily.  , Until Discontinued, Historical Med    aspirin 81 MG tablet Take 81 mg by mouth daily.  , Until Discontinued, Historical Med    bimatoprost (LUMIGAN) 0.03 % ophthalmic solution Apply 1 drop to eye at bedtime.  , Until Discontinued, Historical Med    calcitonin, salmon, (MIACALCIN/FORTICAL) 200 UNIT/ACT nasal spray Place 1 spray into the nose daily.  , Until Discontinued, Historical Med    Calcium Carbonate-Vitamin D (CALCIUM 600+D) 600-200 MG-UNIT TABS Take 1 tablet by mouth 2 (two) times daily.  , Until Discontinued, Historical Med    citalopram (CELEXA) 40 MG tablet Take 40 mg by mouth daily.  , Until Discontinued, Historical Med      dorzolamide-timolol (COSOPT) 22.3-6.8 MG/ML ophthalmic solution Apply 1 drop to eye 2 (two) times daily.  , Until Discontinued, Historical Med    fish oil-omega-3 fatty acids 1000 MG capsule Take 2 g by mouth daily.  , Until Discontinued, Historical Med    gabapentin (NEURONTIN) 100 MG capsule Take 300 mg by mouth at bedtime.  , Until Discontinued, Historical Med    guaiFENesin-dextromethorphan (ROBITUSSIN DM) 100-10 MG/5ML syrup Take 10 mLs by mouth 3 (three) times daily as needed. For cough  , Until Discontinued, Historical Med    hydrochlorothiazide (MICROZIDE) 12.5 MG capsule Take 12.5 mg by mouth daily.  , Until Discontinued, Historical Med    metoprolol (TOPROL-XL) 50 MG 24 hr tablet Take 50 mg by mouth daily.  , Until Discontinued, Historical Med    Multiple Vitamin (MULITIVITAMIN WITH MINERALS) TABS Take 1 tablet by mouth daily.  , Until Discontinued, Historical Med       Disposition and Follow-up:  PCP in 1 week  Consults:  none   Significant Diagnostic Studies:  Dg Chest 2 View  IMPRESSION: Multifocal patchy opacities, as described above, suspicious for multifocal pneumonia.  Given the focal nature of the lingular opacity, follow-up chest radiographs are suggested to document clearing and exclude underlying mass.   Brief H and P: 89yoF with h/o HTN, known lung nodule, chronic cough and dyspnea, transferred from  [facility] with low grade fever, leukocytosis, multifocal PNA on CXR, and LLE cellulitis.  Hospital Course:  1.Pneumonia, community acquired, superimposed on chronic cough/dyspnea which is followed by Dr.Clint Young and  felt to be benign in etiology. Treated with levaquin and azithromycin Clinically responded well with resolution of fevers, leukocytosis and improvement in cough, and respiratory status and is being discharged on few more days of levaquin 2. Cellulitis: also improved with the above antibiotics and received local wound care as well. 3. Hypokalemia and  hyponatremia improved with IVF and supplementation She will also receive home health RN services at the time of discharge  Time spent on Discharge: SignedZannie Cove [facility] Pager: [phone] 10/13/2011, 9:42 PM

## 2012-07-14 ENCOUNTER — Emergency Department (HOSPITAL_BASED_OUTPATIENT_CLINIC_OR_DEPARTMENT_OTHER)
Admission: EM | Admit: 2012-07-14 | Discharge: 2012-07-14 | Disposition: A | Discharge status: Home / Self Care | Payer: Medicare Other | Attending: Emergency Medicine | Admitting: Emergency Medicine

## 2012-07-14 ENCOUNTER — Encounter (HOSPITAL_BASED_OUTPATIENT_CLINIC_OR_DEPARTMENT_OTHER): Payer: Self-pay | Admitting: *Deleted

## 2012-07-14 DIAGNOSIS — M79609 Pain in unspecified limb: Secondary | ICD-10-CM | POA: Insufficient documentation

## 2012-07-14 DIAGNOSIS — Y92009 Unspecified place in unspecified non-institutional (private) residence as the place of occurrence of the external cause: Secondary | ICD-10-CM | POA: Insufficient documentation

## 2012-07-14 DIAGNOSIS — T50905A Adverse effect of unspecified drugs, medicaments and biological substances, initial encounter: Secondary | ICD-10-CM

## 2012-07-14 DIAGNOSIS — T491X5A Adverse effect of antipruritics, initial encounter: Secondary | ICD-10-CM | POA: Insufficient documentation

## 2012-07-14 DIAGNOSIS — I10 Essential (primary) hypertension: Secondary | ICD-10-CM | POA: Insufficient documentation

## 2012-07-14 NOTE — ED Provider Notes (Signed)
History     CSN: [id number]  Arrival date & time 07/14/12  2019   First MD Initiated Contact with Patient 07/14/12 2049      Chief Complaint  Patient presents with  . Foot Pain    (Consider location/radiation/quality/duration/timing/severity/associated sxs/prior treatment) HPI Pt with chronic L foot pain from prior surgeries and nerve damage attempted to use a capsaicin/camphor ointment this evening which made her pain worse. She was having severe burning pain after using the ointment. She attempted to wash it off with minimal improvement. She had some relief with a cool compress. Since arrival her pain has improved considerably and she feels much better now.   She has chronic cough, unchanged denies any other complaints.   Past Medical History  Diagnosis Date  . Hypertension   . Shortness of breath   . Depression   . GERD (gastroesophageal reflux disease)   . Arthritis   . Anxiety   . Chronic cough     Followed by Fannie Knee   . Abnormal CXR 04/2010    LLL consolidation/scar; also with lung nodules followed by Hal Stoneking  . PUD (peptic ulcer disease)   . Hyperlipidemia   . Glaucoma   . Osteoporosis   . Diastolic dysfunction   . Allergic rhinitis     Past Surgical History  Procedure Date  . Breast surgery     Biopsy 1989  . Eye surgery     cateracts/lazer  . Fracture surgery     toe surgery    Family History  Problem Relation Age of Onset  . Malignant hyperthermia Brother   . Asthma Brother   . Heart disease Brother     One had CABG, one deceased at 24 of MI  . Cancer Brother     One had liver ca from EtOH, one had lung from smoking    History  Substance Use Topics  . Smoking status: Never Smoker   . Smokeless tobacco: Not on file  . Alcohol Use: No    OB History    Grav Para Term Preterm Abortions TAB SAB Ect Mult Living                  Review of Systems .All other systems reviewed and are negative except as noted in HPI.    Allergies    Cefaclor and Oxycodone hcl  Home Medications   Current Outpatient Rx  Name Route Sig Dispense Refill  . ACETAMINOPHEN-CODEINE #3 300-30 MG PO TABS Oral Take 1 tablet by mouth every 4 (four) hours as needed. For pain      . ALBUTEROL SULFATE HFA 108 (90 BASE) MCG/ACT IN AERS Inhalation Inhale 2 puffs into the lungs every 6 (six) hours as needed. For wheezing     . AMLODIPINE BESYLATE 5 MG PO TABS Oral Take 5 mg by mouth daily.      . ASPIRIN 81 MG PO TABS Oral Take 81 mg by mouth daily.      Marland Kitchen BIMATOPROST 0.03 % OP SOLN Ophthalmic Apply 1 drop to eye at bedtime.      Marland Kitchen CALCITONIN (SALMON) 200 UNIT/ACT NA SOLN Nasal Place 1 spray into the nose daily.      Marland Kitchen CALCIUM CARBONATE-VITAMIN D 600-200 MG-UNIT PO TABS Oral Take 1 tablet by mouth 2 (two) times daily.      Marland Kitchen CITALOPRAM HYDROBROMIDE 40 MG PO TABS Oral Take 40 mg by mouth daily.      . DORZOLAMIDE HCL-TIMOLOL MAL 22.3-6.8 MG/ML  OP SOLN Ophthalmic Apply 1 drop to eye 2 (two) times daily.      . OMEGA-3 FATTY ACIDS 1000 MG PO CAPS Oral Take 2 g by mouth daily.      Marland Kitchen GABAPENTIN 100 MG PO CAPS Oral Take 300 mg by mouth at bedtime.      . GUAIFENESIN-DM 100-10 MG/5ML PO SYRP Oral Take 10 mLs by mouth 3 (three) times daily as needed. For cough      . HYDROCHLOROTHIAZIDE 12.5 MG PO CAPS Oral Take 12.5 mg by mouth daily.      Marland Kitchen METOPROLOL SUCCINATE ER 50 MG PO TB24 Oral Take 50 mg by mouth daily.      . ADULT MULTIVITAMIN W/MINERALS CH Oral Take 1 tablet by mouth daily.        BP 151/62  Pulse 80  Temp 99.6 F (37.6 C) (Oral)  Resp 20  SpO2 92%  Physical Exam  Nursing note and vitals reviewed. Constitutional: She is oriented to person, place, and time. She appears well-developed and well-nourished.  HENT:  Head: Normocephalic and atraumatic.  Eyes: EOM are normal. Pupils are equal, round, and reactive to light.  Neck: Normal range of motion. Neck supple.  Cardiovascular: Normal rate, normal heart sounds and intact distal pulses.    Pulmonary/Chest: Effort normal and breath sounds normal.  Abdominal: Bowel sounds are normal. She exhibits no distension. There is no tenderness.  Musculoskeletal: Normal range of motion. She exhibits no edema and no tenderness.  Neurological: She is alert and oriented to person, place, and time. She has normal strength. No cranial nerve deficit or sensory deficit.  Skin: Skin is warm and dry. No rash noted.  Psychiatric: She has a normal mood and affect.    ED Course  Procedures (including critical care time)  Labs Reviewed - No data to display No results found.   No diagnosis found.    MDM  Medication reaction has resolved. Advised to avoid using that ointment again and return for any other concerns.         Charles B. Bernette Mayers, MD 07/14/12 2144

## 2012-07-14 NOTE — ED Notes (Signed)
Pain to her left foot. States pain started after using OTC Kerasal Cream.

## 2012-11-21 ENCOUNTER — Encounter (HOSPITAL_BASED_OUTPATIENT_CLINIC_OR_DEPARTMENT_OTHER): Payer: Self-pay | Admitting: *Deleted

## 2012-11-21 ENCOUNTER — Emergency Department (HOSPITAL_BASED_OUTPATIENT_CLINIC_OR_DEPARTMENT_OTHER)
Admission: EM | Admit: 2012-11-21 | Discharge: 2012-11-21 | Disposition: A | Discharge status: Home / Self Care | Payer: Medicare Other | Attending: Emergency Medicine | Admitting: Emergency Medicine

## 2012-11-21 ENCOUNTER — Emergency Department (HOSPITAL_BASED_OUTPATIENT_CLINIC_OR_DEPARTMENT_OTHER): Payer: Medicare Other

## 2012-11-21 DIAGNOSIS — R9389 Abnormal findings on diagnostic imaging of other specified body structures: Secondary | ICD-10-CM | POA: Insufficient documentation

## 2012-11-21 DIAGNOSIS — Z8639 Personal history of other endocrine, nutritional and metabolic disease: Secondary | ICD-10-CM | POA: Insufficient documentation

## 2012-11-21 DIAGNOSIS — Z8739 Personal history of other diseases of the musculoskeletal system and connective tissue: Secondary | ICD-10-CM | POA: Insufficient documentation

## 2012-11-21 DIAGNOSIS — Z7982 Long term (current) use of aspirin: Secondary | ICD-10-CM | POA: Insufficient documentation

## 2012-11-21 DIAGNOSIS — Z79899 Other long term (current) drug therapy: Secondary | ICD-10-CM | POA: Insufficient documentation

## 2012-11-21 DIAGNOSIS — I10 Essential (primary) hypertension: Secondary | ICD-10-CM | POA: Insufficient documentation

## 2012-11-21 DIAGNOSIS — R059 Cough, unspecified: Secondary | ICD-10-CM | POA: Insufficient documentation

## 2012-11-21 DIAGNOSIS — Z8679 Personal history of other diseases of the circulatory system: Secondary | ICD-10-CM | POA: Insufficient documentation

## 2012-11-21 DIAGNOSIS — F3289 Other specified depressive episodes: Secondary | ICD-10-CM | POA: Insufficient documentation

## 2012-11-21 DIAGNOSIS — F411 Generalized anxiety disorder: Secondary | ICD-10-CM | POA: Insufficient documentation

## 2012-11-21 DIAGNOSIS — Z8719 Personal history of other diseases of the digestive system: Secondary | ICD-10-CM | POA: Insufficient documentation

## 2012-11-21 DIAGNOSIS — R05 Cough: Secondary | ICD-10-CM

## 2012-11-21 DIAGNOSIS — F329 Major depressive disorder, single episode, unspecified: Secondary | ICD-10-CM | POA: Insufficient documentation

## 2012-11-21 DIAGNOSIS — Z862 Personal history of diseases of the blood and blood-forming organs and certain disorders involving the immune mechanism: Secondary | ICD-10-CM | POA: Insufficient documentation

## 2012-11-21 DIAGNOSIS — R0602 Shortness of breath: Secondary | ICD-10-CM | POA: Insufficient documentation

## 2012-11-21 DIAGNOSIS — Z8711 Personal history of peptic ulcer disease: Secondary | ICD-10-CM | POA: Insufficient documentation

## 2012-11-21 MED ORDER — AZITHROMYCIN 250 MG PO TABS: 250.0000 mg | ORAL_TABLET | Freq: Every day | ORAL | Status: DC

## 2012-11-21 MED ORDER — IPRATROPIUM BROMIDE 0.02 % IN SOLN
0.5000 mg | Freq: Once | RESPIRATORY_TRACT | Status: AC
  Administered 2012-11-21: 0.5 mg via RESPIRATORY_TRACT

## 2012-11-21 MED ORDER — AZITHROMYCIN 250 MG PO TABS
500.0000 mg | ORAL_TABLET | Freq: Once | ORAL | Status: AC
  Administered 2012-11-21: 500 mg via ORAL
  Filled 2012-11-21: qty 2

## 2012-11-21 MED ORDER — ALBUTEROL SULFATE (5 MG/ML) 0.5% IN NEBU
5.0000 mg | INHALATION_SOLUTION | Freq: Once | RESPIRATORY_TRACT | Status: AC
  Administered 2012-11-21: 5 mg via RESPIRATORY_TRACT

## 2012-11-21 MED ORDER — ALBUTEROL SULFATE (5 MG/ML) 0.5% IN NEBU
INHALATION_SOLUTION | RESPIRATORY_TRACT | Status: AC
  Filled 2012-11-21: qty 1

## 2012-11-21 MED ORDER — IPRATROPIUM BROMIDE 0.02 % IN SOLN
RESPIRATORY_TRACT | Status: AC
  Filled 2012-11-21: qty 2.5

## 2012-11-21 MED ORDER — HYDROCOD POLST-CHLORPHEN POLST 10-8 MG/5ML PO LQCR: 5.0000 mL | Freq: Two times a day (BID) | ORAL | Status: DC | PRN

## 2012-11-21 NOTE — ED Notes (Signed)
Prod cough with yellow sputum. No distress.

## 2012-11-21 NOTE — ED Notes (Signed)
Returned from xray

## 2012-11-21 NOTE — ED Provider Notes (Signed)
History    This chart was scribed for Nelia Shi, MD by Leone Payor, ED Scribe. This patient was seen in room MH02/MH02 and the patient's care was started 7:42 PM.   CSN: [id number]  Arrival date & time 11/21/12  7829   First MD Initiated Contact with Patient 11/21/12 1936      Chief Complaint  Patient presents with  . Cough     The history is provided by the patient. No language interpreter was used.   Molly Cortez is a 79 y.o. female who presents to the Emergency Department complaining of a chronic, constant cough sometimes productive of sputum for about 4 years with the last episode starting today. She reports having some yellow sputum earlier today has associated SOB. She reports stopped use of metoprolol by PCP. Pt states she has had multiple chest xrays in the past and has seen a pulmonary specialist as well. She denies fever, chills, nausea, vomiting, diarrhea.    Pt has h/o HTN, GERD, chronic cough, SOB.  Pt denies smoking and alcohol use.  Past Medical History  Diagnosis Date  . Hypertension   . Shortness of breath   . Depression   . GERD (gastroesophageal reflux disease)   . Arthritis   . Anxiety   . Chronic cough     Followed by Fannie Knee   . Abnormal CXR 04/2010    LLL consolidation/scar; also with lung nodules followed by Hal Stoneking  . PUD (peptic ulcer disease)   . Hyperlipidemia   . Glaucoma(365)   . Osteoporosis   . Diastolic dysfunction   . Allergic rhinitis     Past Surgical History  Procedure Laterality Date  . Breast surgery      Biopsy 1989  . Eye surgery      cateracts/lazer  . Fracture surgery      toe surgery    Family History  Problem Relation Age of Onset  . Malignant hyperthermia Brother   . Asthma Brother   . Heart disease Brother     One had CABG, one deceased at 70 of MI  . Cancer Brother     One had liver ca from EtOH, one had lung from smoking    History  Substance Use Topics  . Smoking status: Never Smoker    . Smokeless tobacco: Not on file  . Alcohol Use: No    No OB history provided.   Review of Systems A complete 10 system review of systems was obtained and all systems are negative except as noted in the HPI and PMH.    Allergies  Cefaclor and Oxycodone hcl  Home Medications   Current Outpatient Rx  Name  Route  Sig  Dispense  Refill  . acetaminophen-codeine (TYLENOL #3) 300-30 MG per tablet   Oral   Take 1 tablet by mouth every 4 (four) hours as needed. For pain           . albuterol (PROVENTIL HFA;VENTOLIN HFA) 108 (90 BASE) MCG/ACT inhaler   Inhalation   Inhale 2 puffs into the lungs every 6 (six) hours as needed. For wheezing          . amLODipine (NORVASC) 5 MG tablet   Oral   Take 5 mg by mouth daily.           Marland Kitchen aspirin 81 MG tablet   Oral   Take 81 mg by mouth daily.           . bimatoprost (  LUMIGAN) 0.03 % ophthalmic solution   Ophthalmic   Apply 1 drop to eye at bedtime.           . calcitonin, salmon, (MIACALCIN/FORTICAL) 200 UNIT/ACT nasal spray   Nasal   Place 1 spray into the nose daily.           . Calcium Carbonate-Vitamin D (CALCIUM 600+D) 600-200 MG-UNIT TABS   Oral   Take 1 tablet by mouth 2 (two) times daily.           . citalopram (CELEXA) 40 MG tablet   Oral   Take 40 mg by mouth daily.           . dorzolamide-timolol (COSOPT) 22.3-6.8 MG/ML ophthalmic solution   Ophthalmic   Apply 1 drop to eye 2 (two) times daily.           . fish oil-omega-3 fatty acids 1000 MG capsule   Oral   Take 2 g by mouth daily.           Marland Kitchen gabapentin (NEURONTIN) 100 MG capsule   Oral   Take 300 mg by mouth at bedtime.           Marland Kitchen guaiFENesin-dextromethorphan (ROBITUSSIN DM) 100-10 MG/5ML syrup   Oral   Take 10 mLs by mouth 3 (three) times daily as needed. For cough           . hydrochlorothiazide (MICROZIDE) 12.5 MG capsule   Oral   Take 12.5 mg by mouth daily.           . metoprolol (TOPROL-XL) 50 MG 24 hr tablet    Oral   Take 50 mg by mouth daily.           . Multiple Vitamin (MULITIVITAMIN WITH MINERALS) TABS   Oral   Take 1 tablet by mouth daily.             BP 137/64  Pulse 86  Temp(Src) 98.7 F (37.1 C) (Oral)  Resp 20  Ht 4\' 10"  (1.473 m)  Wt 122 lb (55.339 kg)  BMI 25.51 kg/m2  SpO2 96%  Physical Exam  Nursing note and vitals reviewed. Constitutional: She is oriented to person, place, and time. She appears well-developed and well-nourished. No distress.  HENT:  Head: Normocephalic and atraumatic.  Eyes: Pupils are equal, round, and reactive to light.  Neck: Normal range of motion.  Cardiovascular: Normal rate and intact distal pulses.   Pulmonary/Chest: No respiratory distress. She has wheezes (Mild scattered wheezes).  Abdominal: Normal appearance. She exhibits no distension.  Musculoskeletal: Normal range of motion.  Neurological: She is alert and oriented to person, place, and time. No cranial nerve deficit.  Skin: Skin is warm and dry. No rash noted.  Psychiatric: She has a normal mood and affect. Her behavior is normal.    ED Course  Procedures (including critical care time)  DIAGNOSTIC STUDIES: Oxygen Saturation is 95% on room air, adequate by my interpretation.    COORDINATION OF CARE:  7:45 PM Discussed treatment plan which includes CXR, albuterol, Atrovent with pt at bedside and pt agreed to plan.    Labs Reviewed - No data to display Dg Chest 2 View  11/21/2012  *RADIOLOGY REPORT*  Clinical Data: Cough  CHEST - 2 VIEW  Comparison: 04/27/2012  Findings: Mass-like opacity in the right mid lung/perihilar region. Additional patchy right upper lobe opacity.  No pleural effusion or pneumothorax.  The heart is normal in size.  Exaggerated thoracic kyphosis with multiple mid  thoracic compression deformities.  IMPRESSION: Mass-like opacity in the right mid lung/perihilar region.   CT chest with contrast is suggested for further evaluation.   Original Report  Authenticated By: Charline Bills, M.D.      No diagnosis found.    MDM  I personally performed the services described in this documentation, which was scribed in my presence. The recorded information has been reviewed and considered.   I discussed the abnormal chest x-ray with the patient.  She did not want to wait for a CT scan of the chest tonight.  She requested I start her on antibiotics and give her something for the cough and she is going to followup with Dr. Pete Glatter early next week.  I think this is a reasonable approach and have agreed to the plan.  I discussed patient's allergies with her and she stated she taken Tussionex several times in the past with no problems.  She said he usually works for her cough.  Nelia Shi, MD 11/21/12 2129

## 2012-11-24 ENCOUNTER — Other Ambulatory Visit: Payer: Self-pay | Admitting: Geriatric Medicine

## 2012-11-24 DIAGNOSIS — R918 Other nonspecific abnormal finding of lung field: Secondary | ICD-10-CM

## 2012-12-01 ENCOUNTER — Ambulatory Visit
Admission: RE | Admit: 2012-12-01 | Discharge: 2012-12-01 | Disposition: A | Discharge status: Home / Self Care | Payer: Medicare Other | Source: Ambulatory Visit | Attending: Geriatric Medicine | Admitting: Geriatric Medicine

## 2012-12-01 DIAGNOSIS — R918 Other nonspecific abnormal finding of lung field: Secondary | ICD-10-CM

## 2012-12-01 MED ORDER — IOHEXOL 300 MG/ML  SOLN
75.0000 mL | Freq: Once | INTRAMUSCULAR | Status: AC | PRN
  Administered 2012-12-01: 75 mL via INTRAVENOUS

## 2014-03-03 ENCOUNTER — Inpatient Hospital Stay (HOSPITAL_COMMUNITY): Payer: Medicare Other

## 2014-03-03 ENCOUNTER — Inpatient Hospital Stay (HOSPITAL_COMMUNITY)
Admission: EM | Admit: 2014-03-03 | Discharge: 2014-03-09 | DRG: 563 | Disposition: A | Discharge status: Skilled Nursing Facility | Payer: Medicare Other | Attending: Internal Medicine | Admitting: Internal Medicine

## 2014-03-03 ENCOUNTER — Encounter (HOSPITAL_COMMUNITY): Payer: Self-pay | Admitting: Emergency Medicine

## 2014-03-03 ENCOUNTER — Emergency Department (HOSPITAL_COMMUNITY): Payer: Medicare Other

## 2014-03-03 DIAGNOSIS — D72829 Elevated white blood cell count, unspecified: Secondary | ICD-10-CM | POA: Diagnosis present

## 2014-03-03 DIAGNOSIS — I951 Orthostatic hypotension: Secondary | ICD-10-CM | POA: Diagnosis present

## 2014-03-03 DIAGNOSIS — M810 Age-related osteoporosis without current pathological fracture: Secondary | ICD-10-CM | POA: Diagnosis present

## 2014-03-03 DIAGNOSIS — E860 Dehydration: Secondary | ICD-10-CM | POA: Diagnosis present

## 2014-03-03 DIAGNOSIS — H409 Unspecified glaucoma: Secondary | ICD-10-CM | POA: Diagnosis present

## 2014-03-03 DIAGNOSIS — K279 Peptic ulcer, site unspecified, unspecified as acute or chronic, without hemorrhage or perforation: Secondary | ICD-10-CM | POA: Diagnosis present

## 2014-03-03 DIAGNOSIS — F329 Major depressive disorder, single episode, unspecified: Secondary | ICD-10-CM | POA: Diagnosis present

## 2014-03-03 DIAGNOSIS — I10 Essential (primary) hypertension: Secondary | ICD-10-CM | POA: Diagnosis present

## 2014-03-03 DIAGNOSIS — S32511A Fracture of superior rim of right pubis, initial encounter for closed fracture: Secondary | ICD-10-CM

## 2014-03-03 DIAGNOSIS — M25551 Pain in right hip: Secondary | ICD-10-CM | POA: Clinically undetermined

## 2014-03-03 DIAGNOSIS — Z800 Family history of malignant neoplasm of digestive organs: Secondary | ICD-10-CM

## 2014-03-03 DIAGNOSIS — R55 Syncope and collapse: Secondary | ICD-10-CM | POA: Diagnosis present

## 2014-03-03 DIAGNOSIS — E785 Hyperlipidemia, unspecified: Secondary | ICD-10-CM | POA: Diagnosis present

## 2014-03-03 DIAGNOSIS — Z8249 Family history of ischemic heart disease and other diseases of the circulatory system: Secondary | ICD-10-CM

## 2014-03-03 DIAGNOSIS — Z801 Family history of malignant neoplasm of trachea, bronchus and lung: Secondary | ICD-10-CM

## 2014-03-03 DIAGNOSIS — F3289 Other specified depressive episodes: Secondary | ICD-10-CM | POA: Diagnosis present

## 2014-03-03 DIAGNOSIS — S4291XA Fracture of right shoulder girdle, part unspecified, initial encounter for closed fracture: Secondary | ICD-10-CM | POA: Diagnosis present

## 2014-03-03 DIAGNOSIS — K219 Gastro-esophageal reflux disease without esophagitis: Secondary | ICD-10-CM | POA: Diagnosis present

## 2014-03-03 DIAGNOSIS — S32509A Unspecified fracture of unspecified pubis, initial encounter for closed fracture: Secondary | ICD-10-CM | POA: Diagnosis present

## 2014-03-03 DIAGNOSIS — S42293A Other displaced fracture of upper end of unspecified humerus, initial encounter for closed fracture: Principal | ICD-10-CM | POA: Diagnosis present

## 2014-03-03 DIAGNOSIS — E871 Hypo-osmolality and hyponatremia: Secondary | ICD-10-CM | POA: Diagnosis present

## 2014-03-03 DIAGNOSIS — R42 Dizziness and giddiness: Secondary | ICD-10-CM | POA: Diagnosis present

## 2014-03-03 DIAGNOSIS — S32591A Other specified fracture of right pubis, initial encounter for closed fracture: Secondary | ICD-10-CM

## 2014-03-03 DIAGNOSIS — H8110 Benign paroxysmal vertigo, unspecified ear: Secondary | ICD-10-CM

## 2014-03-03 DIAGNOSIS — W19XXXA Unspecified fall, initial encounter: Secondary | ICD-10-CM | POA: Diagnosis present

## 2014-03-03 DIAGNOSIS — Z8711 Personal history of peptic ulcer disease: Secondary | ICD-10-CM

## 2014-03-03 DIAGNOSIS — S42209A Unspecified fracture of upper end of unspecified humerus, initial encounter for closed fracture: Secondary | ICD-10-CM

## 2014-03-03 DIAGNOSIS — Z825 Family history of asthma and other chronic lower respiratory diseases: Secondary | ICD-10-CM

## 2014-03-03 DIAGNOSIS — E7800 Pure hypercholesterolemia, unspecified: Secondary | ICD-10-CM | POA: Diagnosis present

## 2014-03-03 HISTORY — DX: Personal history of other diseases of the digestive system: Z87.19

## 2014-03-03 HISTORY — DX: Fracture of right shoulder girdle, part unspecified, initial encounter for closed fracture: S42.91XA

## 2014-03-03 HISTORY — DX: Reserved for concepts with insufficient information to code with codable children: IMO0002

## 2014-03-03 HISTORY — DX: Pneumonia, unspecified organism: J18.9

## 2014-03-03 HISTORY — DX: Unspecified asthma, uncomplicated: J45.909

## 2014-03-03 LAB — CBC WITH DIFFERENTIAL/PLATELET
Basophils Absolute: 0 10*3/uL (ref 0.0–0.1)
Basophils Relative: 0 % (ref 0–1)
EOS ABS: 0.2 10*3/uL (ref 0.0–0.7)
EOS PCT: 1 % (ref 0–5)
HCT: 37.2 % (ref 36.0–46.0)
HEMOGLOBIN: 12.4 g/dL (ref 12.0–15.0)
LYMPHS ABS: 1.2 10*3/uL (ref 0.7–4.0)
LYMPHS PCT: 9 % — AB (ref 12–46)
MCH: 30.5 pg (ref 26.0–34.0)
MCHC: 33.3 g/dL (ref 30.0–36.0)
MCV: 91.6 fL (ref 78.0–100.0)
MONOS PCT: 10 % (ref 3–12)
Monocytes Absolute: 1.3 10*3/uL — ABNORMAL HIGH (ref 0.1–1.0)
Neutro Abs: 11 10*3/uL — ABNORMAL HIGH (ref 1.7–7.7)
Neutrophils Relative %: 80 % — ABNORMAL HIGH (ref 43–77)
Platelets: 260 10*3/uL (ref 150–400)
RBC: 4.06 MIL/uL (ref 3.87–5.11)
RDW: 14.3 % (ref 11.5–15.5)
WBC: 13.7 10*3/uL — AB (ref 4.0–10.5)

## 2014-03-03 LAB — CBC
HEMATOCRIT: 34.6 % — AB (ref 36.0–46.0)
HEMOGLOBIN: 11.8 g/dL — AB (ref 12.0–15.0)
MCH: 30.7 pg (ref 26.0–34.0)
MCHC: 34.1 g/dL (ref 30.0–36.0)
MCV: 90.1 fL (ref 78.0–100.0)
Platelets: 255 10*3/uL (ref 150–400)
RBC: 3.84 MIL/uL — AB (ref 3.87–5.11)
RDW: 14.3 % (ref 11.5–15.5)
WBC: 15.3 10*3/uL — ABNORMAL HIGH (ref 4.0–10.5)

## 2014-03-03 LAB — BASIC METABOLIC PANEL
BUN: 10 mg/dL (ref 6–23)
CALCIUM: 8.7 mg/dL (ref 8.4–10.5)
CO2: 26 meq/L (ref 19–32)
Chloride: 101 mEq/L (ref 96–112)
Creatinine, Ser: 0.51 mg/dL (ref 0.50–1.10)
GFR calc Af Amer: >90 mL/min (ref >90)
GFR, EST NON AFRICAN AMERICAN: 79 mL/min — AB (ref >90)
GLUCOSE: 105 mg/dL — AB (ref 70–99)
POTASSIUM: 4.4 meq/L (ref 3.7–5.3)
Sodium: 140 mEq/L (ref 137–147)

## 2014-03-03 LAB — CREATININE, SERUM
Creatinine, Ser: 0.53 mg/dL (ref 0.50–1.10)
GFR calc Af Amer: >90 mL/min (ref >90)
GFR, EST NON AFRICAN AMERICAN: 78 mL/min — AB (ref >90)

## 2014-03-03 LAB — MAGNESIUM: MAGNESIUM: 1.6 mg/dL (ref 1.5–2.5)

## 2014-03-03 MED ORDER — ENOXAPARIN SODIUM 40 MG/0.4ML ~~LOC~~ SOLN
40.0000 mg | SUBCUTANEOUS | Status: DC
  Administered 2014-03-03 – 2014-03-08 (×5): 40 mg via SUBCUTANEOUS
  Filled 2014-03-03 (×7): qty 0.4

## 2014-03-03 MED ORDER — TRAMADOL HCL 50 MG PO TABS
50.0000 mg | ORAL_TABLET | Freq: Four times a day (QID) | ORAL | Status: DC | PRN
  Administered 2014-03-04 – 2014-03-06 (×6): 50 mg via ORAL
  Filled 2014-03-03 (×8): qty 1

## 2014-03-03 MED ORDER — ACETAMINOPHEN 650 MG RE SUPP: 650.0000 mg | Freq: Four times a day (QID) | RECTAL | Status: DC | PRN

## 2014-03-03 MED ORDER — ONDANSETRON HCL 4 MG PO TABS: 4.0000 mg | ORAL_TABLET | Freq: Four times a day (QID) | ORAL | Status: DC | PRN

## 2014-03-03 MED ORDER — CALCITONIN (SALMON) 200 UNIT/ACT NA SOLN
1.0000 | Freq: Every day | NASAL | Status: DC
  Administered 2014-03-04 – 2014-03-09 (×6): 1 via NASAL
  Filled 2014-03-03: qty 3.7

## 2014-03-03 MED ORDER — ACETAMINOPHEN 325 MG PO TABS: 650.0000 mg | ORAL_TABLET | Freq: Four times a day (QID) | ORAL | Status: DC | PRN

## 2014-03-03 MED ORDER — LORAZEPAM 0.5 MG PO TABS
0.5000 mg | ORAL_TABLET | Freq: Two times a day (BID) | ORAL | Status: DC | PRN
  Administered 2014-03-07: 0.5 mg via ORAL
  Filled 2014-03-03: qty 1

## 2014-03-03 MED ORDER — HYDROMORPHONE HCL PF 1 MG/ML IJ SOLN
0.5000 mg | Freq: Once | INTRAMUSCULAR | Status: AC
  Administered 2014-03-03: 0.5 mg via INTRAVENOUS
  Filled 2014-03-03: qty 1

## 2014-03-03 MED ORDER — ALBUTEROL SULFATE (2.5 MG/3ML) 0.083% IN NEBU: 2.5000 mg | INHALATION_SOLUTION | RESPIRATORY_TRACT | Status: DC | PRN

## 2014-03-03 MED ORDER — SODIUM CHLORIDE 0.9 % IV SOLN: INTRAVENOUS | Status: DC

## 2014-03-03 MED ORDER — GABAPENTIN 300 MG PO CAPS
300.0000 mg | ORAL_CAPSULE | Freq: Two times a day (BID) | ORAL | Status: DC
  Administered 2014-03-03 – 2014-03-09 (×12): 300 mg via ORAL
  Filled 2014-03-03 (×13): qty 1

## 2014-03-03 MED ORDER — SODIUM CHLORIDE 0.9 % IJ SOLN
3.0000 mL | Freq: Two times a day (BID) | INTRAMUSCULAR | Status: DC
  Administered 2014-03-05 – 2014-03-08 (×5): 3 mL via INTRAVENOUS

## 2014-03-03 MED ORDER — ADULT MULTIVITAMIN W/MINERALS CH
1.0000 | ORAL_TABLET | Freq: Every day | ORAL | Status: DC
  Administered 2014-03-04 – 2014-03-09 (×6): 1 via ORAL
  Filled 2014-03-03 (×6): qty 1

## 2014-03-03 MED ORDER — OMEGA-3 FATTY ACIDS 1000 MG PO CAPS: 1.0000 g | ORAL_CAPSULE | Freq: Every day | ORAL | Status: DC

## 2014-03-03 MED ORDER — DOCUSATE SODIUM 100 MG PO CAPS
100.0000 mg | ORAL_CAPSULE | Freq: Two times a day (BID) | ORAL | Status: DC
Start: 2014-03-03 — End: 2014-03-09
  Administered 2014-03-03 – 2014-03-09 (×12): 100 mg via ORAL
  Filled 2014-03-03 (×13): qty 1

## 2014-03-03 MED ORDER — ONDANSETRON HCL 4 MG/2ML IJ SOLN: 4.0000 mg | Freq: Four times a day (QID) | INTRAMUSCULAR | Status: DC | PRN

## 2014-03-03 MED ORDER — ALBUTEROL SULFATE HFA 108 (90 BASE) MCG/ACT IN AERS: 2.0000 | INHALATION_SPRAY | RESPIRATORY_TRACT | Status: DC | PRN

## 2014-03-03 MED ORDER — CALCIUM CARBONATE-VITAMIN D 500-200 MG-UNIT PO TABS
1.0000 | ORAL_TABLET | Freq: Two times a day (BID) | ORAL | Status: DC
  Administered 2014-03-03 – 2014-03-09 (×12): 1 via ORAL
  Filled 2014-03-03 (×13): qty 1

## 2014-03-03 MED ORDER — ACETAMINOPHEN-CODEINE #3 300-30 MG PO TABS
1.0000 | ORAL_TABLET | ORAL | Status: DC | PRN
  Administered 2014-03-03 – 2014-03-04 (×3): 1 via ORAL
  Filled 2014-03-03 (×3): qty 1

## 2014-03-03 MED ORDER — ALUM & MAG HYDROXIDE-SIMETH 200-200-20 MG/5ML PO SUSP: 30.0000 mL | Freq: Four times a day (QID) | ORAL | Status: DC | PRN

## 2014-03-03 MED ORDER — POLYETHYLENE GLYCOL 3350 17 G PO PACK
17.0000 g | PACK | Freq: Every day | ORAL | Status: DC | PRN
  Administered 2014-03-06: 17 g via ORAL
  Filled 2014-03-03: qty 1

## 2014-03-03 MED ORDER — CITALOPRAM HYDROBROMIDE 40 MG PO TABS
40.0000 mg | ORAL_TABLET | Freq: Every day | ORAL | Status: DC
  Filled 2014-03-03: qty 1

## 2014-03-03 MED ORDER — IPRATROPIUM BROMIDE 0.02 % IN SOLN: 0.5000 mg | RESPIRATORY_TRACT | Status: DC | PRN

## 2014-03-03 MED ORDER — PANTOPRAZOLE SODIUM 40 MG PO TBEC
40.0000 mg | DELAYED_RELEASE_TABLET | Freq: Every day | ORAL | Status: DC
  Administered 2014-03-04 – 2014-03-08 (×5): 40 mg via ORAL
  Filled 2014-03-03 (×4): qty 1

## 2014-03-03 MED ORDER — KETOROLAC TROMETHAMINE 15 MG/ML IJ SOLN
15.0000 mg | Freq: Four times a day (QID) | INTRAMUSCULAR | Status: AC | PRN
  Administered 2014-03-04 – 2014-03-05 (×2): 15 mg via INTRAVENOUS
  Filled 2014-03-03 (×2): qty 1

## 2014-03-03 MED ORDER — SORBITOL 70 % SOLN
30.0000 mL | Freq: Every day | Status: DC | PRN
  Administered 2014-03-05: 30 mL via ORAL
  Filled 2014-03-03: qty 30

## 2014-03-03 MED ORDER — DORZOLAMIDE HCL-TIMOLOL MAL 2-0.5 % OP SOLN
1.0000 [drp] | Freq: Every day | OPHTHALMIC | Status: DC
  Administered 2014-03-04 – 2014-03-09 (×6): 1 [drp] via OPHTHALMIC
  Filled 2014-03-03: qty 10

## 2014-03-03 MED ORDER — OMEGA-3-ACID ETHYL ESTERS 1 G PO CAPS
1.0000 g | ORAL_CAPSULE | Freq: Every day | ORAL | Status: DC
  Administered 2014-03-04 – 2014-03-06 (×2): 1 g via ORAL
  Filled 2014-03-03 (×6): qty 1

## 2014-03-03 MED ORDER — MAGNESIUM CITRATE PO SOLN
1.0000 | Freq: Once | ORAL | Status: AC | PRN
  Filled 2014-03-03: qty 296

## 2014-03-03 MED ORDER — FLUTICASONE PROPIONATE HFA 44 MCG/ACT IN AERO
1.0000 | INHALATION_SPRAY | Freq: Two times a day (BID) | RESPIRATORY_TRACT | Status: DC
Start: 2014-03-03 — End: 2014-03-09
  Administered 2014-03-03 – 2014-03-09 (×11): 1 via RESPIRATORY_TRACT
  Filled 2014-03-03: qty 10.6

## 2014-03-03 MED ORDER — LATANOPROST 0.005 % OP SOLN
1.0000 [drp] | Freq: Every day | OPHTHALMIC | Status: DC
  Administered 2014-03-03 – 2014-03-08 (×6): 1 [drp] via OPHTHALMIC
  Filled 2014-03-03: qty 2.5

## 2014-03-03 MED ORDER — ASPIRIN 81 MG PO CHEW
81.0000 mg | CHEWABLE_TABLET | Freq: Every day | ORAL | Status: DC
  Administered 2014-03-03 – 2014-03-09 (×7): 81 mg via ORAL
  Filled 2014-03-03 (×5): qty 1

## 2014-03-03 NOTE — ED Provider Notes (Signed)
Medical screening examination/treatment/procedure(s) were conducted as a shared visit with non-physician practitioner(s) and myself.  I personally evaluated the patient during the encounter.   EKG Interpretation   Date/Time:  Thursday Mar 03 2014 13:04:32 EDT Ventricular Rate:  74 PR Interval:  212 QRS Duration: 89 QT Interval:  433 QTC Calculation: 480 R Axis:   55 Text Interpretation:  Atrial-paced complexes Borderline prolonged PR  interval Abnormal R-wave progression, early transition No previous ECGs  available Confirmed by Lakiesha Ralphs  MD, Kripa Foskey [phone]([id number]) on 03/03/2014 1:12:51  PM Also confirmed by Deretha EmoryZACKOWSKI  MD, Raniya Golembeski [phone]([id number])  on 03/03/2014 1:13:08 PM      Patient seen by me. Patient with a right shoulder proximal fracture. Of the humerus head. Patient uses a walker at home plus is been having some vertigo dizziness problems. Will require admission probably will have to go for rehabilitation. He is not able to cannulate very well without the use of her walker. Will contact admitting folks. Treatment of the humeral fracture can be done with a sling. Patient with reasonable pain control but not completely pain-free. Patient's right shoulder shows no significant deformity but there is bruising in the area radial pulses 2+.  Vanetta MuldersScott Josten Warmuth, MD 03/03/14 1530

## 2014-03-03 NOTE — ED Notes (Signed)
Pt arrived via ems sp fall from standing position, c/o pain in right shoulder. Pt states fall caused by dizziness that she woke with this am. Pt alertx4 mae randomly. Pt takes asa daily.

## 2014-03-03 NOTE — ED Provider Notes (Signed)
CSN: [id number]     Arrival date & time 03/03/14  1259 History   First MD Initiated Contact with Patient 03/03/14 1259     Chief Complaint  Patient presents with  . Near Syncope  . Shoulder Injury     (Consider location/radiation/quality/duration/timing/severity/associated sxs/prior Treatment) Patient is a 80 y.o. female presenting with fall. The history is provided by the patient and a relative. No language interpreter was used.  Fall This is a new problem. The current episode started today. Pertinent negatives include no abdominal pain, chest pain, congestion, fever, headaches, nausea, numbness, vomiting or weakness. Associated symptoms comments: The patient woke this morning with dizziness like the room was spinning similar to previous episodes of vertigo. She attempted to ambulate with her walker and fell secondary to her dizziness, landing on her right shoulder causing severe pain. She denies LOC, head injury, nausea, vomiting, chest or abdominal pain. She does not feel injury to hips or LE's. .    Past Medical History  Diagnosis Date  . Hypertension   . Shortness of breath   . Depression   . GERD (gastroesophageal reflux disease)   . Arthritis   . Anxiety   . Chronic cough     Followed by Fannie Knee   . Abnormal CXR 04/2010    LLL consolidation/scar; also with lung nodules followed by Hal Stoneking  . PUD (peptic ulcer disease)   . Hyperlipidemia   . Glaucoma   . Osteoporosis   . Diastolic dysfunction   . Allergic rhinitis    Past Surgical History  Procedure Laterality Date  . Breast surgery      Biopsy 1989  . Eye surgery      cateracts/lazer  . Fracture surgery      toe surgery   Family History  Problem Relation Age of Onset  . Malignant hyperthermia Brother   . Asthma Brother   . Heart disease Brother     One had CABG, one deceased at 64 of MI  . Cancer Brother     One had liver ca from EtOH, one had lung from smoking   History  Substance Use Topics  .  Smoking status: Never Smoker   . Smokeless tobacco: Not on file  . Alcohol Use: No   OB History   Grav Para Term Preterm Abortions TAB SAB Ect Mult Living                 Review of Systems  Constitutional: Negative for fever.  HENT: Negative for congestion.   Respiratory: Negative for shortness of breath.   Cardiovascular: Negative for chest pain and palpitations.  Gastrointestinal: Negative for nausea, vomiting and abdominal pain.  Genitourinary: Negative for dysuria.  Musculoskeletal:       See HPI.  Skin: Negative for wound.  Neurological: Positive for dizziness. Negative for syncope, speech difficulty, weakness, numbness and headaches.  Psychiatric/Behavioral: Negative for confusion.      Allergies  Cefaclor and Oxycodone hcl  Home Medications   Prior to Admission medications   Medication Sig Start Date End Date Taking? Authorizing Provider  acetaminophen-codeine (TYLENOL #3) 300-30 MG per tablet Take 1 tablet by mouth every 4 (four) hours as needed. For pain      Historical Provider, MD  albuterol (PROVENTIL HFA;VENTOLIN HFA) 108 (90 BASE) MCG/ACT inhaler Inhale 2 puffs into the lungs every 6 (six) hours as needed. For wheezing     Historical Provider, MD  amLODipine (NORVASC) 5 MG tablet Take 5 mg by  mouth daily.      Historical Provider, MD  aspirin 81 MG tablet Take 81 mg by mouth daily.      Historical Provider, MD  azithromycin (ZITHROMAX Z-PAK) 250 MG tablet Take 1 tablet (250 mg total) by mouth daily. 11/21/12   Nelia Shiobert L Beaton, MD  bimatoprost (LUMIGAN) 0.03 % ophthalmic solution Apply 1 drop to eye at bedtime.      Historical Provider, MD  calcitonin, salmon, (MIACALCIN/FORTICAL) 200 UNIT/ACT nasal spray Place 1 spray into the nose daily.      Historical Provider, MD  Calcium Carbonate-Vitamin D (CALCIUM 600+D) 600-200 MG-UNIT TABS Take 1 tablet by mouth 2 (two) times daily.      Historical Provider, MD  chlorpheniramine-HYDROcodone (TUSSIONEX PENNKINETIC ER) 10-8  MG/5ML LQCR Take 5 mLs by mouth every 12 (twelve) hours as needed. 11/21/12   Nelia Shiobert L Beaton, MD  citalopram (CELEXA) 40 MG tablet Take 40 mg by mouth daily.      Historical Provider, MD  dorzolamide-timolol (COSOPT) 22.3-6.8 MG/ML ophthalmic solution Apply 1 drop to eye 2 (two) times daily.      Historical Provider, MD  fish oil-omega-3 fatty acids 1000 MG capsule Take 2 g by mouth daily.      Historical Provider, MD  gabapentin (NEURONTIN) 100 MG capsule Take 300 mg by mouth at bedtime.      Historical Provider, MD  guaiFENesin-dextromethorphan (ROBITUSSIN DM) 100-10 MG/5ML syrup Take 10 mLs by mouth 3 (three) times daily as needed. For cough      Historical Provider, MD  hydrochlorothiazide (MICROZIDE) 12.5 MG capsule Take 12.5 mg by mouth daily.      Historical Provider, MD  metoprolol (TOPROL-XL) 50 MG 24 hr tablet Take 50 mg by mouth daily.      Historical Provider, MD  Multiple Vitamin (MULITIVITAMIN WITH MINERALS) TABS Take 1 tablet by mouth daily.      Historical Provider, MD   BP 112/53  Pulse 68  Temp(Src) 97.6 F (36.4 C) (Oral)  Resp 21  Wt 122 lb 8 oz (55.566 kg)  SpO2 97% Physical Exam  Constitutional: She is oriented to person, place, and time. She appears well-developed and well-nourished. No distress.  HENT:  Head: Atraumatic.  Eyes: Conjunctivae are normal. Pupils are equal, round, and reactive to light.  Neck: Normal range of motion. Neck supple.  Cardiovascular: Normal rate.   No murmur heard. Pulmonary/Chest: Effort normal. She has no wheezes. She has no rales.  Abdominal: Soft. There is no tenderness. There is no rebound and no guarding.  Musculoskeletal:  Significant tenderness to right shoulder and proximal humerus. No gross bony deformity. No clavicular deformity or tenderness. Elbow and wrist are non-tender, grip strength 5/5 and equal bilateral UE's.   Neurological: She is alert and oriented to person, place, and time.  CN's 3-12 grossly intact. No attempt  made to ambulate or sit up secondary to shoulder injury and degree of pain with movement. She has normal visual tracking without nystagmus. Gross motor coordination intact without deficit. Speech is clear and focused. No facial asymmetry.  Skin: Skin is warm and dry.  Psychiatric: She has a normal mood and affect.    ED Course  Procedures (including critical care time) Labs Review Labs Reviewed - No data to display  Imaging Review No results found.   EKG Interpretation   Date/Time:  Thursday Mar 03 2014 13:04:32 EDT Ventricular Rate:  74 PR Interval:  212 QRS Duration: 89 QT Interval:  433 QTC Calculation: 480 R  Axis:   55 Text Interpretation:  Atrial-paced complexes Borderline prolonged PR  interval Abnormal R-wave progression, early transition No previous ECGs  available Confirmed by Deretha EmoryZACKOWSKI  MD, SCOTT [phone]([id number]) on 03/03/2014 1:12:51  PM Also confirmed by Deretha EmoryZACKOWSKI  MD, SCOTT [phone]([id number])  on 03/03/2014 1:13:08 PM      MDM   Final diagnoses:  None    1. Subcapital humeral fracture, right 2. Symptoms of vertigo  The patient has had vertigo in the past and reports similar symptoms this morning. No focal determinate neurologic deficit on exam. Suspect dizziness is recurrent vertigo as opposed to acute cerebral/cerebellar infarct. Patient is evaluated by Dr. Deretha EmoryZackowski.   She ambulates at home with walker only. She will be unable to manage at home and is considered for admission, with plan to discharge to rehab nursing home placement. Discussed with her orthopedist, Dr. Farris HasKramer, who will provide consultation. Discussed with hospitalist who accepts the patient for admission.      Arnoldo HookerShari A Derral Colucci, PA-C 03/05/14 1550

## 2014-03-03 NOTE — H&P (Signed)
[facility] History and Physical  Molly Cortez Brocksmith RUE:[id number]:[id number] DOB: 04/27/1917 DOA: 03/03/2014  Referring physician: Dr. Deretha EmoryZackowski PCP: Ginette OttoSTONEKING,HAL THOMAS, MD   Chief Complaint: Fall/shoulder pain  HPI: Molly Cortez Reffitt is a 80 y.o. female  With history of hypertension, depression, gastroesophageal reflux disease, chronic cough, peptic ulcer disease, hyperlipidemia who presents to the ED after a fall with complaints of right shoulder. Patient stated that she woke up this morning and felt dizzy but did not feel like there was a spinning. Patient stated that she be and was unsteady and subsequently put on a close. Patient stated that she was seen at the foot of the bed with a walker in front and that she was going to get up to stand to go to dictation she felt like she was going to fall tried to grab on the walker and subsequently fell on her right shoulder and right hip. Patient stated that at that time her daughter-in-law was there and subsequently called 911. Patient denied a spinning sensation around her. Patient denies any syncope. Patient denies any chest pain. No shortness of breath. No abdominal pain. No diarrhea. No fever, no chills, no nausea, no vomiting, no change in chronic cough. No weakness. Patient does and also occasional constipation. Patient also with some questionable dysuria. Patient was seen in the emergency room was felt patient had vertigo. Plain films of the right shoulder which were done showed an acute impacted subcapital fracture of the right humerus with fracture involving the greater tuberosity. Basic metabolic profile which was done was unremarkable. CBC which was done had a white count of 13.7 with a left shift. EKG showed a normal sinus rhythm. Poor R-wave progression. It was noted that on presentation to the ED in June her stay in the ED patient's systolic blood pressure dropped to 88. Urinalysis was not done. Chest x-ray was not done. CT of the head was not  done. Marland Kitchen. [facility] orthopedics have been consulted, Dr. Farris HasKramer who will assess the patient. We were called to admit the patient for further evaluation and management.   Review of Systems: As per history of present illness otherwise negative. Constitutional:  No weight loss, night sweats, Fevers, chills, fatigue.  HEENT:  No headaches, Difficulty swallowing,Tooth/dental problems,Sore throat,  No sneezing, itching, ear ache, nasal congestion, post nasal drip,  Cardio-vascular:  No chest pain, Orthopnea, PND, swelling in lower extremities, anasarca, dizziness, palpitations  GI:  No heartburn, indigestion, abdominal pain, nausea, vomiting, diarrhea, change in bowel habits, loss of appetite  Resp:  No shortness of breath with exertion or at rest. No excess mucus, no productive cough, No non-productive cough, No coughing up of blood.No change in color of mucus.No wheezing.No chest wall deformity  Skin:  no rash or lesions.  GU:  no dysuria, change in color of urine, no urgency or frequency. No flank pain.  Musculoskeletal:  No joint pain or swelling. No decreased range of motion. No back pain.  Psych:  No change in mood or affect. No depression or anxiety. No memory loss.   Past Medical History  Diagnosis Date  . Hypertension   . Shortness of breath   . Depression   . GERD (gastroesophageal reflux disease)   . Arthritis   . Anxiety   . Chronic cough     Followed by Fannie Kneelint Young   . Abnormal CXR 04/2010    LLL consolidation/scar; also with lung nodules followed by Hal Stoneking  . PUD (peptic ulcer disease)   . Hyperlipidemia   .  Glaucoma   . Osteoporosis   . Diastolic dysfunction   . Allergic rhinitis    Past Surgical History  Procedure Laterality Date  . Breast surgery      Biopsy 1989  . Eye surgery      cateracts/lazer  . Fracture surgery      toe surgery   Social History:  reports that she has never smoked. She does not have any smokeless tobacco history on file. She  reports that she does not drink alcohol or use illicit drugs.  Allergies  Allergen Reactions  . Cefaclor Other (See Comments)    Unknown reaction   . Oxycodone Hcl Other (See Comments)    Loss of mental control    Family History  Problem Relation Age of Onset  . Malignant hyperthermia Brother   . Asthma Brother   . Heart disease Brother     One had CABG, one deceased at 20 of MI  . Cancer Brother     One had liver ca from EtOH, one had lung from smoking     Prior to Admission medications   Medication Sig Start Date End Date Taking? Authorizing Provider  acetaminophen-codeine (TYLENOL #3) 300-30 MG per tablet Take 1 tablet by mouth at bedtime. For pain    Yes Historical Provider, MD  albuterol (PROVENTIL HFA;VENTOLIN HFA) 108 (90 BASE) MCG/ACT inhaler Inhale 2 puffs into the lungs every 4 (four) hours as needed. For wheezing   Yes Historical Provider, MD  amLODipine (NORVASC) 5 MG tablet Take 5 mg by mouth daily.     Yes Historical Provider, MD  aspirin 81 MG tablet Take 81 mg by mouth daily.     Yes Historical Provider, MD  beclomethasone (QVAR) 80 MCG/ACT inhaler Inhale 1 puff into the lungs 2 (two) times daily.   Yes Historical Provider, MD  bimatoprost (LUMIGAN) 0.03 % ophthalmic solution Place 1 drop into both eyes at bedtime.    Yes Historical Provider, MD  calcitonin, salmon, (MIACALCIN/FORTICAL) 200 UNIT/ACT nasal spray Place 1 spray into alternate nostrils daily.    Yes Historical Provider, MD  Calcium Carbonate-Vitamin D (CALCIUM 600+D) 600-200 MG-UNIT TABS Take 1 tablet by mouth 2 (two) times daily.     Yes Historical Provider, MD  citalopram (CELEXA) 40 MG tablet Take 40 mg by mouth daily.     Yes Historical Provider, MD  dorzolamide-timolol (COSOPT) 22.3-6.8 MG/ML ophthalmic solution Place 1 drop into both eyes daily.    Yes Historical Provider, MD  fish oil-omega-3 fatty acids 1000 MG capsule Take 1 g by mouth daily.    Yes Historical Provider, MD  gabapentin  (NEURONTIN) 300 MG capsule Take 300 mg by mouth 2 (two) times daily.   Yes Historical Provider, MD  hydrochlorothiazide (MICROZIDE) 12.5 MG capsule Take 12.5 mg by mouth daily.     Yes Historical Provider, MD  LORazepam (ATIVAN) 0.5 MG tablet Take 0.5 mg by mouth 2 (two) times daily as needed for anxiety.   Yes Historical Provider, MD  Multiple Vitamin (MULITIVITAMIN WITH MINERALS) TABS Take 1 tablet by mouth daily.    Yes Historical Provider, MD   Physical Exam: Filed Vitals:   03/03/14 1715  BP: 108/50  Pulse:   Temp:   Resp:     BP 108/50  Pulse 83  Temp(Src) 97.6 F (36.4 C) (Oral)  Resp 26  Wt 55.566 kg (122 lb 8 oz)  SpO2 96%  General:  Frail elderly female in no acute cardiopulmonary distress. Speaking in full sentences.  Appears calm and comfortable Eyes: PERRLA, EOMI, oropharynx clear, no lesions, no exudates, normal lids, irises & conjunctiva ENT: grossly normal hearing, lips & tongue Neck: no LAD, masses or thyromegaly Cardiovascular: RRR, no m/r/g. No LE edema. Respiratory: CTA bilaterally in the anterior lung fields, no w/r/r. Normal respiratory effort. Abdomen: soft, ntnd, positive bowel sounds, no rebound, no guarding Skin: no rash or induration seen on limited exam Musculoskeletal: grossly normal tone BUE/BLE. Right shoulder tender to palpation. Right shoulder with some bruising noted. Psychiatric: grossly normal mood and affect, speech fluent and appropriate Neurologic: Alert and oriented x3. Cranial nerves II through XII are grossly intact. No focal deficits. Gait not tested secondary to safety.           Labs on Admission:  Basic Metabolic Panel:  Recent Labs Lab 03/03/14 1444  NA 140  K 4.4  CL 101  CO2 26  GLUCOSE 105*  BUN 10  CREATININE 0.51  CALCIUM 8.7   Liver Function Tests: No results found for this basename: AST, ALT, ALKPHOS, BILITOT, PROT, ALBUMIN,  in the last 168 hours No results found for this basename: LIPASE, AMYLASE,  in the last  168 hours No results found for this basename: AMMONIA,  in the last 168 hours CBC:  Recent Labs Lab 03/03/14 1444  WBC 13.7*  NEUTROABS 11.0*  HGB 12.4  HCT 37.2  MCV 91.6  PLT 260   Cardiac Enzymes: No results found for this basename: CKTOTAL, CKMB, CKMBINDEX, TROPONINI,  in the last 168 hours  BNP (last 3 results) No results found for this basename: PROBNP,  in the last 8760 hours CBG: No results found for this basename: GLUCAP,  in the last 168 hours  Radiological Exams on Admission: Dg Shoulder Right  03/03/2014   CLINICAL DATA:  Near syncopal episode status post fall now with right shoulder pain  EXAM: RIGHT SHOULDER - 2+ VIEW  COMPARISON:  None.  FINDINGS: The patient has sustained an impacted subcapital fracture of the right humeral neck with fracture of the greater tuberosity. The glenohumeral joint is intact. The [facility] is intact. The scapula exhibits no acute abnormality. The observed portions of the right clavicle and upper right ribs appear normal.  IMPRESSION: There is an acute impacted subcapital fracture of the right humerus with fracture involving the greater tuberosity.   Electronically Signed   By: David  Swazil[place]   On: 03/03/2014 14:28    EKG: Independently reviewed. Normal sinus rhythm. Poor R-wave progression. Borderline prolonged PR interval.  Assessment/Plan Principal Problem:   Near syncope Active Problems:   HYPERCHOLESTEROLEMIA   HYPERLIPIDEMIA   HYPERTENSION   PEPTIC ULCER DISEASE   Shoulder fracture, right   Dizziness   Dehydration   Fall   Leukocytosis    #1 near-syncope Questionable etiology. May be secondary to hypotension as patient was noted to have blood pressure in the emergency room was 88/42. Check orthostatics. Etiologies could also include A. infectious etiology as patient noted to have a leukocytosis with left shift. Will check a chest x-ray, CT head, carotid Dopplers, 2-D echo. Hold blood pressure medications for now. Place on IV  fluids. Follow.  #2 right shoulder fracture Secondary to mechanical fall.Marland Kitchen. [facility] orthopedics, Dr. Farris HasKramer has been consulted and will assess the patient. Patient has been placed in a sling for now. PT/OT. Pain management.  #3 hypertension Patient had presented with borderline hypotension with systolic blood pressures in the high 80s to 90s. Will hold patient's antihypertensive medications for now. Her blood  pressure management as needed Will start of with patient's home regimen of Norvasc or Toprol.  #4 peptic ulcer disease Stable. Place on a PPI.  #5 dehydration Gentle IV fluid hydration.  #6 leukocytosis May be reactive versus infectious etiology. Check a chest x-ray. Check a UA with cultures and sensitivities. Patient is currently afebrile. Will monitor for now.  #7 Fall Likely secondary to problem #1. See problem #1. PT/OT.  #8 Glaucoma ,Continue eyedrops.  #9 prophylaxis PPI for GI prophylaxis. Lovenox for DVT prophylaxis.    Code Status: Full Family Communication: updated patient and son and son in law at bedside. Disposition Plan: Admit to telemetry  Time spent: 70 mins  Rodolph Bong MD [facility] Pager [phone]  **Disclaimer: This note may have been dictated with voice recognition software. Similar sounding words can inadvertently be transcribed and this note may contain transcription errors which may not have been corrected upon publication of note.**

## 2014-03-03 NOTE — ED Notes (Signed)
Pt given 100 ml bolus related to bp Melvenia BeamShari Pac aware

## 2014-03-03 NOTE — Progress Notes (Signed)
Spoke with Dr. Thompson, [name] is not rule out stroke at this time, stroke protocol does not need to be done per MD. Molly Cortez

## 2014-03-04 ENCOUNTER — Inpatient Hospital Stay (HOSPITAL_COMMUNITY): Payer: Medicare Other

## 2014-03-04 DIAGNOSIS — I951 Orthostatic hypotension: Secondary | ICD-10-CM

## 2014-03-04 DIAGNOSIS — D72829 Elevated white blood cell count, unspecified: Secondary | ICD-10-CM

## 2014-03-04 DIAGNOSIS — I359 Nonrheumatic aortic valve disorder, unspecified: Secondary | ICD-10-CM

## 2014-03-04 DIAGNOSIS — R55 Syncope and collapse: Secondary | ICD-10-CM

## 2014-03-04 LAB — COMPREHENSIVE METABOLIC PANEL
ALK PHOS: 62 U/L (ref 39–117)
ALT: 14 U/L (ref 0–35)
AST: 25 U/L (ref 0–37)
Albumin: 3 g/dL — ABNORMAL LOW (ref 3.5–5.2)
BUN: 15 mg/dL (ref 6–23)
CO2: 22 mEq/L (ref 19–32)
Calcium: 8.9 mg/dL (ref 8.4–10.5)
Chloride: 101 mEq/L (ref 96–112)
Creatinine, Ser: 0.52 mg/dL (ref 0.50–1.10)
GFR calc Af Amer: >90 mL/min (ref >90)
GFR calc non Af Amer: 79 mL/min — ABNORMAL LOW (ref >90)
Glucose, Bld: 110 mg/dL — ABNORMAL HIGH (ref 70–99)
Potassium: 3.7 mEq/L (ref 3.7–5.3)
SODIUM: 137 meq/L (ref 137–147)
TOTAL PROTEIN: 6.3 g/dL (ref 6.0–8.3)
Total Bilirubin: 0.8 mg/dL (ref 0.3–1.2)

## 2014-03-04 LAB — URINALYSIS, ROUTINE W REFLEX MICROSCOPIC
Bilirubin Urine: NEGATIVE
Glucose, UA: NEGATIVE mg/dL
Hgb urine dipstick: NEGATIVE
Ketones, ur: 15 mg/dL — AB
LEUKOCYTES UA: NEGATIVE
Nitrite: NEGATIVE
PROTEIN: NEGATIVE mg/dL
Specific Gravity, Urine: 1.021 (ref 1.005–1.030)
Urobilinogen, UA: 0.2 mg/dL (ref 0.0–1.0)
pH: 6.5 (ref 5.0–8.0)

## 2014-03-04 LAB — CBC
HEMATOCRIT: 32.8 % — AB (ref 36.0–46.0)
Hemoglobin: 10.9 g/dL — ABNORMAL LOW (ref 12.0–15.0)
MCH: 30.5 pg (ref 26.0–34.0)
MCHC: 33.2 g/dL (ref 30.0–36.0)
MCV: 91.9 fL (ref 78.0–100.0)
Platelets: 223 10*3/uL (ref 150–400)
RBC: 3.57 MIL/uL — ABNORMAL LOW (ref 3.87–5.11)
RDW: 14.5 % (ref 11.5–15.5)
WBC: 13 10*3/uL — AB (ref 4.0–10.5)

## 2014-03-04 LAB — MAGNESIUM: MAGNESIUM: 1.8 mg/dL (ref 1.5–2.5)

## 2014-03-04 MED ORDER — CITALOPRAM HYDROBROMIDE 20 MG PO TABS
20.0000 mg | ORAL_TABLET | Freq: Every day | ORAL | Status: DC
  Administered 2014-03-05 – 2014-03-09 (×5): 20 mg via ORAL
  Filled 2014-03-04 (×5): qty 1

## 2014-03-04 MED ORDER — CHLORHEXIDINE GLUCONATE 0.12 % MT SOLN
15.0000 mL | Freq: Two times a day (BID) | OROMUCOSAL | Status: DC
  Administered 2014-03-05 – 2014-03-09 (×9): 15 mL via OROMUCOSAL
  Filled 2014-03-04 (×12): qty 15

## 2014-03-04 MED ORDER — CITALOPRAM HYDROBROMIDE 20 MG PO TABS
20.0000 mg | ORAL_TABLET | Freq: Once | ORAL | Status: AC
  Administered 2014-03-04: 20 mg via ORAL
  Filled 2014-03-04: qty 1

## 2014-03-04 MED ORDER — BIOTENE DRY MOUTH MT LIQD
15.0000 mL | Freq: Two times a day (BID) | OROMUCOSAL | Status: DC
  Administered 2014-03-05 – 2014-03-09 (×9): 15 mL via OROMUCOSAL

## 2014-03-04 MED ORDER — FUROSEMIDE 10 MG/ML IJ SOLN
20.0000 mg | Freq: Once | INTRAMUSCULAR | Status: AC
  Administered 2014-03-04: 20 mg via INTRAVENOUS
  Filled 2014-03-04: qty 2

## 2014-03-04 NOTE — Clinical Social Work Psychosocial (Signed)
Clinical Social Work Department  BRIEF PSYCHOSOCIAL ASSESSMENT  Patient:Molly Cortez Account Number: [id number]  Admit date: 03/03/14 Clinical Social Worker Rhea Pink, MSW Date/Time: 03/04/2014 2:00 PM Referred by: Physician Date Referred:  Referred for   SNF Placement   Other Referral:  Interview type: Patient and patient's daughter-in-law Other interview type: PSYCHOSOCIAL DATA  Living Status:Husband Admitted from facility:  Level of care:  Primary support name: Molly Cortez Primary support relationship to patient: Son Degree of support available:  Strong and vested  CURRENT CONCERNS  Current Concerns   Post-Acute Placement   Other Concerns:  SOCIAL WORK ASSESSMENT / PLAN  CSW met with pt to offer support and discuss SNF placement. Patient's daught-in-law was at bedside. CSW the placement process and the daughter reported that they would like [facility].  Patient was very jovial but reported being in a lot of pain. Patient was understanding and knows that she will need Rehab prior to returning home. re: PT recommendation for SNF.   Pt lives alone  CSW explained placement process and answered questions.   Pt reports [facility]  as her preference    CSW completed FL2 and initiated SNF search.     Assessment/plan status: Information/Referral to [facility]  Other assessment/ plan:  Information/referral to community resources:  SNF   PTAR  PATIENT'S/FAMILY'S RESPONSE TO PLAN OF CARE:  Pt  reports she is agreeable to [facility] in order to increase strength and independence with mobility prior to returning home  Pt verbalized understanding of placement process and appreciation for CSW assist.   Rhea Pink, MSW, [place]

## 2014-03-04 NOTE — Evaluation (Signed)
Occupational Therapy Evaluation Patient Details Name: Billey CoBertha B Meter MRN: [id number] DOB: 11/02/1916 Today's Date: 03/04/2014    History of Present Illness Pt with syncopal event at home landing on her right side with right humerus fx awaiting orthopedic consult   Clinical Impression   Pt demonstrates decline in function and safety with ADLs and ADL mobility with decreased strength, balance, R UE ROM/function and endurance. Pt would benefit from acute OT services to address impairments to increase level of function and safety    Follow Up Recommendations  SNF;Supervision/Assistance - 24 hour    Equipment Recommendations  None recommended by OT;Other (comment) (TBD at next venue of care)    Recommendations for Other Services       Precautions / Restrictions Precautions Precautions: Fall (fall) Required Braces or Orthoses: Sling Restrictions Weight Bearing Restrictions: Yes RUE Weight Bearing: Non weight bearing      Mobility Bed Mobility Overal bed mobility: Needs Assistance Bed Mobility: Sit to Supine Rolling: Mod assist Sidelying to sit: Mod assist   Sit to supine: Max assist;+2 for physical assistance   General bed mobility comments: pt up in recliner upon entering room  Transfers Overall transfer level: Needs assistance   Transfers: Sit to/from Stand;Stand Pivot Transfers Sit to Stand: Mod assist;+2 physical assistance;+2 safety/equipment Stand pivot transfers: Mod assist       General transfer comment: max verbal and physical cues    Balance Overall balance assessment: Needs assistance Sitting-balance support: Single extremity supported;Feet supported Sitting balance-Leahy Scale: Fair Sitting balance - Comments: EOB pt with dizziness with head turns, unable to note nystagmus but pt not maintaining eyes open   Standing balance support: Bilateral upper extremity supported;During functional activity Standing balance-Leahy Scale: Poor                              ADL Overall ADL's : Needs assistance/impaired Eating/Feeding: Set up;Minimal assistance Eating/Feeding Details (indicate cue type and reason):  (R UE immobilized, A for cutting food and opening containers) Grooming: Wash/dry hands;Wash/dry face;Set up;Minimal assistance   Upper Body Bathing: Maximal assistance   Lower Body Bathing: Total assistance   Upper Body Dressing : Maximal assistance   Lower Body Dressing: Total assistance   Toilet Transfer: Moderate assistance;+2 for physical assistance;+2 for safety/equipment;Cueing for safety;Cueing for sequencing;Stand-pivot;BSC   Toileting- Clothing Manipulation and Hygiene: Total assistance       Functional mobility during ADLs: Moderate assistance;+2 for physical assistance;+2 for safety/equipment       Vision  wears glasses                   Perception Perception Perception Tested?: No   Praxis Praxis Praxis tested?: Not tested    Pertinent Vitals/Pain 7/10 R UE, VSS     Hand Dominance Left   Extremity/Trunk Assessment Upper Extremity Assessment Upper Extremity Assessment: Generalized weakness;RUE deficits/detail RUE Deficits / Details: sling R UE due to fracture RUE: Unable to fully assess due to immobilization   Lower Extremity Assessment Lower Extremity Assessment: Defer to PT evaluation   Cervical / Trunk Assessment Cervical / Trunk Assessment: Kyphotic   Communication Communication Communication: No difficulties   Cognition Arousal/Alertness: Awake/alert Behavior During Therapy: Anxious Overall Cognitive Status: Impaired/Different from baseline Area of Impairment: Orientation;Attention;Memory;Following commands;Problem solving Orientation Level: Disoriented to;Time   Memory: Decreased short-term memory Following Commands: Follows one step commands with increased time           General Comments  Pt pleasant and cooperative                 Home Living  Family/patient expects to be discharged to:: Skilled nursing facility Living Arrangements: Alone Available Help at Discharge: Family;Available PRN/intermittently Type of Home: House       Home Layout: One level               Home Equipment: Walker - 2 wheels;Cane - single point;Bedside commode;Shower seat;Grab bars - tub/shower          Prior Functioning/Environment Level of Independence: Independent with assistive device(s)        Comments: pt typically walks with walker family does the housework and cooking and live next door to pt    OT Diagnosis: Generalized weakness;Acute pain   OT Problem List: Decreased strength;Decreased knowledge of use of DME or AE;Decreased activity tolerance;Impaired balance (sitting and/or standing);Pain;Decreased cognition   OT Treatment/Interventions: Self-care/ADL training;Therapeutic exercise;Patient/family education;Neuromuscular education;Balance training;Therapeutic activities;DME and/or AE instruction    OT Goals(Current goals can be found in the care plan section) Acute Rehab OT Goals Patient Stated Goal: "to be able to do for myself again and go home" OT Goal Formulation: With patient/family Time For Goal Achievement: 03/11/14 Potential to Achieve Goals: Good ADL Goals Pt Will Perform Eating: with set-up;with supervision;with adaptive utensils;sitting Pt Will Perform Grooming: with min guard assist;with set-up;with supervision;sitting Pt Will Perform Upper Body Bathing: with mod assist;with min assist;sitting Pt Will Perform Upper Body Dressing: with mod assist;with min assist;sitting Pt Will Transfer to Toilet: with mod assist;with min assist;bedside commode  OT Frequency: Min 2X/week   Barriers to D/C: Decreased caregiver support  pt lives at home alone                     End of Session Equipment Utilized During Treatment: Gait belt;Other (comment) (BSC)  Activity Tolerance: Patient limited by pain Patient left:  in bed;with nursing/sitter in room;with family/visitor present   Time: 1109-1130 OT Time Calculation (min): 21 min Charges:  OT General Charges $OT Visit: 1 Procedure OT Evaluation $Initial OT Evaluation Tier I: 1 Procedure OT Treatments $Therapeutic Activity: 8-22 mins G-Codes:    Lafe Garin 2014-04-01, 1:23 PM

## 2014-03-04 NOTE — Progress Notes (Signed)
VASCULAR LAB PRELIMINARY  PRELIMINARY  PRELIMINARY  PRELIMINARY  Carotid duplex  completed.    Preliminary report:  Bilateral:  1-39% ICA stenosis.  Vertebral artery flow is antegrade.      Gara Kroner, RVT 03/04/2014, 12:08 PM

## 2014-03-04 NOTE — Progress Notes (Signed)
Chaplain responded to spiritual consult. Pt was present with her daughter-in-law. Pt stated she has a lot of support from her local church but would still like prayer for healing and for those who are taking care of her. Chaplain offered prayer and provided compassionate presence and conversation. Pt expressed thanks for visit.

## 2014-03-04 NOTE — Evaluation (Signed)
Physical Therapy Evaluation Patient Details Name: Molly Cortez MRN: [id number] DOB: 06/10/1917 Today's Date: 03/04/2014   History of Present Illness  Pt with syncopal event at home landing on her right side with right humerus fx awaiting orthopedic consult  Clinical Impression  Pt very pleasant and anxious regarding mobility as very fearful of falling again. Pt benefits from education and reassurance throughout session. Pt educated for need to maintain RUE NWB as well as sling. Pt and family present for evaluation and will benefit from acute therapy to maximize mobility, function and independence to decrease burden of care at DC. Further vestibular assessment warranted next session as pt able tolerate. Orthostatic hypotension noted particularly with supine to sit.    Follow Up Recommendations SNF;Supervision/Assistance - 24 hour    Equipment Recommendations  None recommended by PT    Recommendations for Other Services OT consult     Precautions / Restrictions Precautions Precautions: Fall Required Braces or Orthoses: Sling Restrictions Weight Bearing Restrictions: Yes RUE Weight Bearing: Non weight bearing      Mobility  Bed Mobility Overal bed mobility: Needs Assistance Bed Mobility: Rolling;Sidelying to Sit Rolling: Mod assist Sidelying to sit: Mod assist       General bed mobility comments: cues for sequence with use of pad to roll to left and assist to bring legs off bed and elevate trunk  Transfers Overall transfer level: Needs assistance   Transfers: Sit to/from Stand;Stand Pivot Transfers Sit to Stand: Min assist Stand pivot transfers: Mod assist       General transfer comment: max cues for safety, precautions and sequence with assist for anterior translation, hand over hand placement and cues to keep eyes open and focus on object throughout  Ambulation/Gait                Stairs            Wheelchair Mobility    Modified Rankin (Stroke  Patients Only)       Balance Overall balance assessment: Needs assistance   Sitting balance-Leahy Scale: Good Sitting balance - Comments: EOB pt with dizziness with head turns, unable to note nystagmus but pt not maintaining eyes open     Standing balance-Leahy Scale: Poor                               Pertinent Vitals/Pain 8/10 right shoulder pain with RN notified, sling adjusted and RUE supported end of session Orthostatic BPs  Supine 127/63  Sitting 103/48  Sitting after 6 min 114/48  Standing 115/91      HR 88 sats 88-90% on RA    Home Living Family/patient expects to be discharged to:: Skilled nursing facility Living Arrangements: Alone Available Help at Discharge: Family;Available PRN/intermittently Type of Home: House       Home Layout: One level Home Equipment: Walker - 2 wheels;Cane - single point;Bedside commode;Shower seat;Grab bars - tub/shower      Prior Function Level of Independence: Independent with assistive device(s)         Comments: pt typically walks with walker family does the housework and cooking and live next door to pt     Hand Dominance   Dominant Hand: Left    Extremity/Trunk Assessment   Upper Extremity Assessment: Defer to OT evaluation           Lower Extremity Assessment: Generalized weakness      Cervical / Trunk Assessment: Kyphotic  Communication  Communication: No difficulties  Cognition Arousal/Alertness: Awake/alert Behavior During Therapy: Anxious Overall Cognitive Status: Impaired/Different from baseline Area of Impairment: Orientation;Memory;Following commands Orientation Level: Disoriented to;Time   Memory: Decreased short-term memory Following Commands: Follows one step commands with increased time            General Comments      Exercises        Assessment/Plan    PT Assessment Patient needs continued PT services  PT Diagnosis Acute pain;Difficulty  walking;Generalized weakness   PT Problem List Decreased strength;Decreased activity tolerance;Decreased mobility;Decreased balance;Decreased safety awareness;Decreased skin integrity;Pain  PT Treatment Interventions Gait training   PT Goals (Current goals can be found in the Care Plan section) Acute Rehab PT Goals Patient Stated Goal: be able to walk and not be dizzy PT Goal Formulation: With patient/family Time For Goal Achievement: 03/18/14 Potential to Achieve Goals: Fair    Frequency Min 3X/week   Barriers to discharge Decreased caregiver support      Co-evaluation               End of Session Equipment Utilized During Treatment: Gait belt;Other (comment) (right UE sling) Activity Tolerance: Patient tolerated treatment well Patient left: in chair;with chair alarm set;with family/visitor present;with nursing/sitter in room Nurse Communication: Mobility status;Precautions         Time: 3582-5189 PT Time Calculation (min): 43 min   Charges:   PT Evaluation $Initial PT Evaluation Tier I: 1 Procedure PT Treatments $Therapeutic Activity: 38-52 mins   PT G Codes:          Cleaven Demario B Greenlee Ancheta 03/04/2014, 10:25 AM Delaney Meigs, PT [phone]

## 2014-03-04 NOTE — Progress Notes (Signed)
Unable to assess Orthostatic vital signs, patient refusing to sit and stand at this time.   Molly Cortez, Charity fundraiser

## 2014-03-04 NOTE — Progress Notes (Signed)
  Echocardiogram 2D Echocardiogram has been performed.  Molly Cortez 03/04/2014, 12:10 PM

## 2014-03-04 NOTE — Progress Notes (Signed)
[facility] PROGRESS NOTE  Molly Cortez PFX:[id number] DOB: 1917/03/25 DOA: 03/03/2014 PCP: Ginette Otto, MD  Assessment/Plan: #1 near-syncope Personal etiology. Likely secondary to orthostasis. CT of the head is negative. 2-D echo negative for any valvular abnormalities. Carotid Dopplers with no significant ICA stenosis. Patient with interstitial edema noted on chest x-ray. Will saline lock IV fluids. TED hose. Repeat orthostasis in the morning. Follow.  #2 orthostasis Blood pressure medications on hold. Normal saline lock IV fluids as chest x-ray with some interstitial edema. TED hose.  #3 acute impacted subcapital fracture of the right humerus Secondary to mechanical fall. Case was discussed with orthopedics and recommending shoulder immobilizer, pain management, outpatient follow up with Dr Thomasena Edis in 1 week.  #4 hypertension BP meds on hold.  #5 leukocytosis UA is negative. Chest x-ray is negative. Likely reactive. No need for antibiotics at this time. Follow.  #6 history of peptic ulcer disease PPI.  #7 fall Likely secondary to problem #2. PT/OT.  #8 glaucoma Continue eyedrops.  #9 prophylaxis PPI for GI prophylaxis Lovenox for DVT prophylaxis.   Code Status: full Family Communication: updated patient and son at bedside. Disposition Plan: home vs SNF when medically stable.   Consultants:  none  Procedures:  CT head 03/03/2014  2-D echo 03/04/2014  Chest x-ray 03/03/2014  X-ray of the right shoulder 03/03/2014, 03/04/2014   Carotid Dopplers 03/04/2014  Antibiotics:  none  HPI/Subjective: Patient still with dizzinnes with positional changes  Objective: Filed Vitals:   03/04/14 1015  BP:   Pulse: 88  Temp:   Resp:    No intake or output data in the 24 hours ending 03/04/14 1016 Filed Weights   03/03/14 1308  Weight: 55.566 kg (122 lb 8 oz)    Exam:   General:  NAD  Cardiovascular: RRR  Respiratory: Some bibasilar  crackles  Abdomen: soft/nt/nd/+bs  Musculoskeletal: No c/c/e  Data Reviewed: Basic Metabolic Panel:  Recent Labs Lab 03/03/14 1444 03/03/14 2058 03/04/14 0515  NA 140  --  137  K 4.4  --  3.7  CL 101  --  101  CO2 26  --  22  GLUCOSE 105*  --  110*  BUN 10  --  15  CREATININE 0.51 0.53 0.52  CALCIUM 8.7  --  8.9  MG  --  1.6  --    Liver Function Tests:  Recent Labs Lab 03/04/14 0515  AST 25  ALT 14  ALKPHOS 62  BILITOT 0.8  PROT 6.3  ALBUMIN 3.0*   No results found for this basename: LIPASE, AMYLASE,  in the last 168 hours No results found for this basename: AMMONIA,  in the last 168 hours CBC:  Recent Labs Lab 03/03/14 1444 03/03/14 2058 03/04/14 0515  WBC 13.7* 15.3* 13.0*  NEUTROABS 11.0*  --   --   HGB 12.4 11.8* 10.9*  HCT 37.2 34.6* 32.8*  MCV 91.6 90.1 91.9  PLT 260 255 223   Cardiac Enzymes: No results found for this basename: CKTOTAL, CKMB, CKMBINDEX, TROPONINI,  in the last 168 hours BNP (last 3 results) No results found for this basename: PROBNP,  in the last 8760 hours CBG: No results found for this basename: GLUCAP,  in the last 168 hours  No results found for this or any previous visit (from the past 240 hour(s)).   Studies: Dg Shoulder Right  03/03/2014   CLINICAL DATA:  Near syncopal episode status post fall now with right shoulder pain  EXAM: RIGHT SHOULDER -  2+ VIEW  COMPARISON:  None.  FINDINGS: The patient has sustained an impacted subcapital fracture of the right humeral neck with fracture of the greater tuberosity. The glenohumeral joint is intact. The [facility] is intact. The scapula exhibits no acute abnormality. The observed portions of the right clavicle and upper right ribs appear normal.  IMPRESSION: There is an acute impacted subcapital fracture of the right humerus with fracture involving the greater tuberosity.   Electronically Signed   By: David  SwazilandJordan   On: 03/03/2014 14:28   Ct Head Wo Contrast  03/03/2014   CLINICAL  DATA:  Larey SeatFell and hit her head this morning.  EXAM: CT HEAD WITHOUT CONTRAST  TECHNIQUE: Contiguous axial images were obtained from the base of the skull through the vertex without intravenous contrast.  COMPARISON:  None.  FINDINGS: Diffusely enlarged ventricles and subarachnoid spaces. Patchy white matter low density in both cerebral hemispheres. No skull fracture, intracranial hemorrhage or paranasal sinus air-fluid levels. Completely opacified sphenoid sinus bilaterally. Bilateral cavernous internal carotid artery atheromatous calcifications and bilateral vertebral and basilar artery atheromatous calcifications.  IMPRESSION: 1. No acute abnormality. 2. Atrophy and chronic small vessel white matter ischemic changes. 3. Chronic sphenoid sinusitis.   Electronically Signed   By: Gordan PaymentSteve  Reid M.D.   On: 03/03/2014 19:50   Dg Chest Port 1 View  03/03/2014   CLINICAL DATA:  Leukocytosis ; difficulty breathing  EXAM: PORTABLE CHEST - 1 VIEW  COMPARISON:  Chest radiograph and chest CT November 21, 2012  FINDINGS: There is underlying emphysematous change. There is generalized interstitial prominence which appears more pronounced than on prior study. There is no frank consolidation. The heart size is within normal limits. The pulmonary vascularity reflects underlying emphysema. There is atherosclerotic change in aorta. No adenopathy. There is evidence of a comminuted fracture of the right proximal humerus which may be recent.  IMPRESSION: Interstitial edema superimposed on emphysematous change.  No airspace consolidation.  Recent appearing comminuted fracture of the proximal right humerus with avulsion of the greater tuberosity on the right as well as impaction at the metaphysis -diaphysis junction.   Electronically Signed   By: Bretta BangWilliam  Woodruff M.D.   On: 03/03/2014 17:56    Scheduled Meds: . aspirin  81 mg Oral Daily  . calcitonin (salmon)  1 spray Alternating Nares Daily  . calcium-vitamin D  1 tablet Oral BID  .  [START ON 03/05/2014] citalopram  20 mg Oral Daily  . docusate sodium  100 mg Oral BID  . dorzolamide-timolol  1 drop Both Eyes Daily  . enoxaparin (LOVENOX) injection  40 mg Subcutaneous Q24H  . fluticasone  1 puff Inhalation BID  . furosemide  20 mg Intravenous Once  . gabapentin  300 mg Oral BID  . latanoprost  1 drop Both Eyes QHS  . multivitamin with minerals  1 tablet Oral Daily  . omega-3 acid ethyl esters  1 g Oral Daily  . pantoprazole  40 mg Oral Daily  . sodium chloride  3 mL Intravenous Q12H   Continuous Infusions:   Principal Problem:   Near syncope Active Problems:   HYPERCHOLESTEROLEMIA   HYPERLIPIDEMIA   HYPERTENSION   PEPTIC ULCER DISEASE   Shoulder fracture, right   Dizziness   Dehydration   Fall   Leukocytosis   Orthostasis    Time spent: 35 mins    Rodolph Bonganiel V Thompson MD [facility] Pager [phone]. If 7PM-7AM, please contact night-coverage at [email or web address], password [email or web address] 03/04/2014, 10:16 AM  LOS: 1  day

## 2014-03-05 LAB — BASIC METABOLIC PANEL
BUN: 19 mg/dL (ref 6–23)
CALCIUM: 8.8 mg/dL (ref 8.4–10.5)
CO2: 24 meq/L (ref 19–32)
Chloride: 96 mEq/L (ref 96–112)
Creatinine, Ser: 0.56 mg/dL (ref 0.50–1.10)
GFR calc Af Amer: 89 mL/min — ABNORMAL LOW (ref >90)
GFR calc non Af Amer: 77 mL/min — ABNORMAL LOW (ref >90)
GLUCOSE: 97 mg/dL (ref 70–99)
Potassium: 3.4 mEq/L — ABNORMAL LOW (ref 3.7–5.3)
Sodium: 131 mEq/L — ABNORMAL LOW (ref 137–147)

## 2014-03-05 LAB — CBC
HCT: 29.4 % — ABNORMAL LOW (ref 36.0–46.0)
Hemoglobin: 9.8 g/dL — ABNORMAL LOW (ref 12.0–15.0)
MCH: 30.4 pg (ref 26.0–34.0)
MCHC: 33.3 g/dL (ref 30.0–36.0)
MCV: 91.3 fL (ref 78.0–100.0)
PLATELETS: 175 10*3/uL (ref 150–400)
RBC: 3.22 MIL/uL — AB (ref 3.87–5.11)
RDW: 14.5 % (ref 11.5–15.5)
WBC: 12.3 10*3/uL — AB (ref 4.0–10.5)

## 2014-03-05 LAB — URINE CULTURE: Colony Count: 15000

## 2014-03-05 NOTE — Progress Notes (Signed)
[facility] PROGRESS NOTE  Molly Cortez [id number] DOB: 06-14-1917 DOA: 03/03/2014 PCP: Ginette Otto, MD  Assessment/Plan: #1 near-syncope Personal etiology. Likely secondary to orthostasis. CT of the head is negative. 2-D echo negative for any valvular abnormalities. Carotid Dopplers with no significant ICA stenosis. Patient with interstitial edema noted on chest x-ray.  TED hose. Repeat orthostasis pending. Follow.  #2 orthostasis ?? Autonomic dysregulation. Blood pressure medications on hold. Normal saline lock IV fluids as chest x-ray with some interstitial edema. TED hose. Repeat orthostatics this morning.  #3 acute impacted subcapital fracture of the right humerus Secondary to mechanical fall. Case was discussed with orthopedics and recommending shoulder immobilizer, pain management, outpatient follow up with Dr Thomasena Edis in 1 week.  #4 hypertension BP meds on hold.  #5 leukocytosis UA is negative. Chest x-ray is negative. Likely reactive. No need for antibiotics at this time. Follow.  #6 history of peptic ulcer disease PPI.  #7 fall Likely secondary to problem #2. PT/OT.  #8 glaucoma Continue eyedrops.  #9 prophylaxis PPI for GI prophylaxis Lovenox for DVT prophylaxis.   Code Status: full Family Communication: updated patient and son at bedside. Disposition Plan: SNF when medically stable.   Consultants:  none  Procedures:  CT head 03/03/2014  2-D echo 03/04/2014  Chest x-ray 03/03/2014  X-ray of the right shoulder 03/03/2014, 03/04/2014   Carotid Dopplers 03/04/2014  Antibiotics:  none  HPI/Subjective: Patient c/o right shoulder pain. Hasn't gotten up yet and not sure about dizziness.  Objective: Filed Vitals:   03/05/14 0503  BP: 99/42  Pulse: 72  Temp: 97.8 F (36.6 C)  Resp: 18    Intake/Output Summary (Last 24 hours) at 03/05/14 0952 Last data filed at 03/05/14 0857  Gross per 24 hour  Intake    120 ml  Output       0 ml  Net    120 ml   Filed Weights   03/03/14 1308 03/05/14 0503  Weight: 55.566 kg (122 lb 8 oz) 55.43 kg (122 lb 3.2 oz)    Exam:   General:  NAD  Cardiovascular: RRR  Respiratory: CTAB  Abdomen: soft/nt/nd/+bs  Musculoskeletal: No c/c/e  Data Reviewed: Basic Metabolic Panel:  Recent Labs Lab 03/03/14 1444 03/03/14 2058 03/04/14 0515 03/05/14 0350  NA 140  --  137 131*  K 4.4  --  3.7 3.4*  CL 101  --  101 96  CO2 26  --  22 24  GLUCOSE 105*  --  110* 97  BUN 10  --  15 19  CREATININE 0.51 0.53 0.52 0.56  CALCIUM 8.7  --  8.9 8.8  MG  --  1.6 1.8  --    Liver Function Tests:  Recent Labs Lab 03/04/14 0515  AST 25  ALT 14  ALKPHOS 62  BILITOT 0.8  PROT 6.3  ALBUMIN 3.0*   No results found for this basename: LIPASE, AMYLASE,  in the last 168 hours No results found for this basename: AMMONIA,  in the last 168 hours CBC:  Recent Labs Lab 03/03/14 1444 03/03/14 2058 03/04/14 0515 03/05/14 0350  WBC 13.7* 15.3* 13.0* 12.3*  NEUTROABS 11.0*  --   --   --   HGB 12.4 11.8* 10.9* 9.8*  HCT 37.2 34.6* 32.8* 29.4*  MCV 91.6 90.1 91.9 91.3  PLT 260 255 223 175   Cardiac Enzymes: No results found for this basename: CKTOTAL, CKMB, CKMBINDEX, TROPONINI,  in the last 168 hours BNP (last 3 results) No results  found for this basename: PROBNP,  in the last 8760 hours CBG: No results found for this basename: GLUCAP,  in the last 168 hours  No results found for this or any previous visit (from the past 240 hour(s)).   Studies: Dg Shoulder Right  03/04/2014   CLINICAL DATA:  Fracture of the proximal right humerus.  EXAM: RIGHT SHOULDER - 2+ VIEW  COMPARISON:  03/03/2014  FINDINGS: The humeral head is located. Negative for dislocation. Bones are osteopenic. Again seen is an impacted subcapital humeral neck fracture. The fracture involves the greater tuberosity. No significant change compared to radiographs 03/03/2014. The acromioclavicular joint is  aligned. No additional fracture site identified.  IMPRESSION: No significant change in impacted subcapital fracture of the right humerus and fracture of the greater tuberosity.   Electronically Signed   By: Britta Mccreedy M.D.   On: 03/04/2014 14:12   Dg Shoulder Right  03/03/2014   CLINICAL DATA:  Near syncopal episode status post fall now with right shoulder pain  EXAM: RIGHT SHOULDER - 2+ VIEW  COMPARISON:  None.  FINDINGS: The patient has sustained an impacted subcapital fracture of the right humeral neck with fracture of the greater tuberosity. The glenohumeral joint is intact. The [facility] joint is intact. The scapula exhibits no acute abnormality. The observed portions of the right clavicle and upper right ribs appear normal.  IMPRESSION: There is an acute impacted subcapital fracture of the right humerus with fracture involving the greater tuberosity.   Electronically Signed   By: David  [place]   On: 03/03/2014 14:28   Ct Head Wo Contrast  03/03/2014   CLINICAL DATA:  Larey Seat and hit her head this morning.  EXAM: CT HEAD WITHOUT CONTRAST  TECHNIQUE: Contiguous axial images were obtained from the base of the skull through the vertex without intravenous contrast.  COMPARISON:  None.  FINDINGS: Diffusely enlarged ventricles and subarachnoid spaces. Patchy white matter low density in both cerebral hemispheres. No skull fracture, intracranial hemorrhage or paranasal sinus air-fluid levels. Completely opacified sphenoid sinus bilaterally. Bilateral cavernous internal carotid artery atheromatous calcifications and bilateral vertebral and basilar artery atheromatous calcifications.  IMPRESSION: 1. No acute abnormality. 2. Atrophy and chronic small vessel white matter ischemic changes. 3. Chronic sphenoid sinusitis.   Electronically Signed   By: Gordan Payment M.D.   On: 03/03/2014 19:50   Dg Chest Port 1 View  03/03/2014   CLINICAL DATA:  Leukocytosis ; difficulty breathing  EXAM: PORTABLE CHEST - 1 VIEW  COMPARISON:   Chest radiograph and chest CT November 21, 2012  FINDINGS: There is underlying emphysematous change. There is generalized interstitial prominence which appears more pronounced than on prior study. There is no frank consolidation. The heart size is within normal limits. The pulmonary vascularity reflects underlying emphysema. There is atherosclerotic change in aorta. No adenopathy. There is evidence of a comminuted fracture of the right proximal humerus which may be recent.  IMPRESSION: Interstitial edema superimposed on emphysematous change.  No airspace consolidation.  Recent appearing comminuted fracture of the proximal right humerus with avulsion of the greater tuberosity on the right as well as impaction at the metaphysis -diaphysis junction.   Electronically Signed   By: Bretta Bang M.D.   On: 03/03/2014 17:56    Scheduled Meds: . antiseptic oral rinse  15 mL Mouth Rinse q12n4p  . aspirin  81 mg Oral Daily  . calcitonin (salmon)  1 spray Alternating Nares Daily  . calcium-vitamin D  1 tablet Oral BID  .  chlorhexidine  15 mL Mouth Rinse BID  . citalopram  20 mg Oral Daily  . docusate sodium  100 mg Oral BID  . dorzolamide-timolol  1 drop Both Eyes Daily  . enoxaparin (LOVENOX) injection  40 mg Subcutaneous Q24H  . fluticasone  1 puff Inhalation BID  . gabapentin  300 mg Oral BID  . latanoprost  1 drop Both Eyes QHS  . multivitamin with minerals  1 tablet Oral Daily  . omega-3 acid ethyl esters  1 g Oral Daily  . pantoprazole  40 mg Oral Daily  . sodium chloride  3 mL Intravenous Q12H   Continuous Infusions:   Principal Problem:   Near syncope Active Problems:   HYPERCHOLESTEROLEMIA   HYPERLIPIDEMIA   HYPERTENSION   PEPTIC ULCER DISEASE   Shoulder fracture, right   Dizziness   Dehydration   Fall   Leukocytosis   Orthostasis    Time spent: 35 mins    Rodolph Bonganiel V Thompson MD [facility] Pager [phone]. If 7PM-7AM, please contact night-coverage at [email or web address],  password [email or web address] 03/05/2014, 9:52 AM  LOS: 2 days

## 2014-03-05 NOTE — ED Provider Notes (Signed)
Medical screening examination/treatment/procedure(s) were conducted as a shared visit with non-physician practitioner(s) and myself.  I personally evaluated the patient during the encounter.   EKG Interpretation   Date/Time:  Thursday Mar 03 2014 13:04:32 EDT Ventricular Rate:  74 PR Interval:  212 QRS Duration: 89 QT Interval:  433 QTC Calculation: 480 R Axis:   55 Text Interpretation:  Atrial-paced complexes Borderline prolonged PR  interval Abnormal R-wave progression, early transition No previous ECGs  available Confirmed by Deretha Emory  MD, Jaleel Allen [phone]) on 03/03/2014 1:12:51  PM Also confirmed by Deretha Emory  MD, Makesha Belitz [phone])  on 03/03/2014 1:13:08 PM       Vanetta Mulders, MD 03/05/14 1827

## 2014-03-05 NOTE — Progress Notes (Signed)
[facility] PROGRESS NOTE  Molly Cortez YQM:[id number] DOB: 05/04/1917 DOA: 03/03/2014 PCP: Ginette OttoSTONEKING,HAL THOMAS, MD  Assessment/Plan: #1 near-syncope Personal etiology. Likely secondary to orthostasis. CT of the head is negative. 2-D echo negative for any valvular abnormalities. Carotid Dopplers with no significant ICA stenosis. Patient with interstitial edema noted on chest x-ray.  TED hose. Repeat orthostasis pending. Follow.  #2 orthostasis ?? Autonomic dysregulation. Blood pressure medications on hold. Normal saline lock IV fluids as chest x-ray with some interstitial edema. TED hose. Repeat orthostatics this morning.  #3 acute impacted subcapital fracture of the right humerus Secondary to mechanical fall. Case was discussed with orthopedics and recommending shoulder immobilizer, pain management, outpatient follow up with Dr Thomasena Edisollins in 1 week.  #4 hypertension BP meds on hold.  #5 leukocytosis UA is negative. Chest x-ray is negative. Likely reactive. No need for antibiotics at this time. Follow.  #6 history of peptic ulcer disease PPI.  #7 fall Likely secondary to problem #2. PT/OT.  #8 glaucoma Continue eyedrops.  #9 prophylaxis PPI for GI prophylaxis Lovenox for DVT prophylaxis.   Code Status: full Family Communication: updated patient and son at bedside. Disposition Plan: SNF when medically stable.   Consultants:  none  Procedures:  CT head 03/03/2014  2-D echo 03/04/2014  Chest x-ray 03/03/2014  X-ray of the right shoulder 03/03/2014, 03/04/2014   Carotid Dopplers 03/04/2014  Antibiotics:  none  HPI/Subjective: Patient c/o right shoulder pain. Hasn't gotten up yet and not sure about dizziness.  Objective: Filed Vitals:   03/05/14 1300  BP: 129/51  Pulse: 81  Temp: 97.8 F (36.6 C)  Resp: 18    Intake/Output Summary (Last 24 hours) at 03/05/14 1356 Last data filed at 03/05/14 0857  Gross per 24 hour  Intake    120 ml  Output       0 ml  Net    120 ml   Filed Weights   03/03/14 1308 03/05/14 0503  Weight: 55.566 kg (122 lb 8 oz) 55.43 kg (122 lb 3.2 oz)    Exam:   General:  NAD  Cardiovascular: RRR  Respiratory: CTAB  Abdomen: soft/nt/nd/+bs  Musculoskeletal: No c/c/e  Data Reviewed: Basic Metabolic Panel:  Recent Labs Lab 03/03/14 1444 03/03/14 2058 03/04/14 0515 03/05/14 0350  NA 140  --  137 131*  K 4.4  --  3.7 3.4*  CL 101  --  101 96  CO2 26  --  22 24  GLUCOSE 105*  --  110* 97  BUN 10  --  15 19  CREATININE 0.51 0.53 0.52 0.56  CALCIUM 8.7  --  8.9 8.8  MG  --  1.6 1.8  --    Liver Function Tests:  Recent Labs Lab 03/04/14 0515  AST 25  ALT 14  ALKPHOS 62  BILITOT 0.8  PROT 6.3  ALBUMIN 3.0*   No results found for this basename: LIPASE, AMYLASE,  in the last 168 hours No results found for this basename: AMMONIA,  in the last 168 hours CBC:  Recent Labs Lab 03/03/14 1444 03/03/14 2058 03/04/14 0515 03/05/14 0350  WBC 13.7* 15.3* 13.0* 12.3*  NEUTROABS 11.0*  --   --   --   HGB 12.4 11.8* 10.9* 9.8*  HCT 37.2 34.6* 32.8* 29.4*  MCV 91.6 90.1 91.9 91.3  PLT 260 255 223 175   Cardiac Enzymes: No results found for this basename: CKTOTAL, CKMB, CKMBINDEX, TROPONINI,  in the last 168 hours BNP (last 3 results) No results  found for this basename: PROBNP,  in the last 8760 hours CBG: No results found for this basename: GLUCAP,  in the last 168 hours  Recent Results (from the past 240 hour(s))  URINE CULTURE     Status: None   Collection Time    03/04/14 10:35 AM      Result Value Ref Range Status   Specimen Description URINE, RANDOM   Final   Special Requests NONE   Final   Culture  Setup Time     Final   Value: 03/04/2014 14:43     Performed at [facility] Count     Final   Value: 15,000 COLONIES/ML     Performed at [facility]   Culture     Final   Value: Multiple bacterial morphotypes present, none predominant. Suggest  appropriate recollection if clinically indicated.     Performed at [facility]   Report Status 03/05/2014 FINAL   Final     Studies: Dg Shoulder Right  03/04/2014   CLINICAL DATA:  Fracture of the proximal right humerus.  EXAM: RIGHT SHOULDER - 2+ VIEW  COMPARISON:  03/03/2014  FINDINGS: The humeral head is located. Negative for dislocation. Bones are osteopenic. Again seen is an impacted subcapital humeral neck fracture. The fracture involves the greater tuberosity. No significant change compared to radiographs 03/03/2014. The acromioclavicular joint is aligned. No additional fracture site identified.  IMPRESSION: No significant change in impacted subcapital fracture of the right humerus and fracture of the greater tuberosity.   Electronically Signed   By: Britta Mccreedy M.D.   On: 03/04/2014 14:12   Dg Shoulder Right  03/03/2014   CLINICAL DATA:  Near syncopal episode status post fall now with right shoulder pain  EXAM: RIGHT SHOULDER - 2+ VIEW  COMPARISON:  None.  FINDINGS: The patient has sustained an impacted subcapital fracture of the right humeral neck with fracture of the greater tuberosity. The glenohumeral joint is intact. [facility] joint is intact. The scapula exhibits no acute abnormality. The observed portions of the right clavicle and upper right ribs appear normal.  IMPRESSION: There is an acute impacted subcapital fracture of the right humerus with fracture involving the greater tuberosity.   Electronically Signed   By: David  [place]   On: 03/03/2014 14:28   Ct Head Wo Contrast  03/03/2014   CLINICAL DATA:  Larey Seat and hit her head this morning.  EXAM: CT HEAD WITHOUT CONTRAST  TECHNIQUE: Contiguous axial images were obtained from the base of the skull through the vertex without intravenous contrast.  COMPARISON:  None.  FINDINGS: Diffusely enlarged ventricles and subarachnoid spaces. Patchy white matter low density in both cerebral hemispheres. No skull fracture, intracranial  hemorrhage or paranasal sinus air-fluid levels. Completely opacified sphenoid sinus bilaterally. Bilateral cavernous internal carotid artery atheromatous calcifications and bilateral vertebral and basilar artery atheromatous calcifications.  IMPRESSION: 1. No acute abnormality. 2. Atrophy and chronic small vessel white matter ischemic changes. 3. Chronic sphenoid sinusitis.   Electronically Signed   By: Gordan Payment M.D.   On: 03/03/2014 19:50   Dg Chest Port 1 View  03/03/2014   CLINICAL DATA:  Leukocytosis ; difficulty breathing  EXAM: PORTABLE CHEST - 1 VIEW  COMPARISON:  Chest radiograph and chest CT November 21, 2012  FINDINGS: There is underlying emphysematous change. There is generalized interstitial prominence which appears more pronounced than on prior study. There is no frank consolidation. The heart size is within normal limits.  The pulmonary vascularity reflects underlying emphysema. There is atherosclerotic change in aorta. No adenopathy. There is evidence of a comminuted fracture of the right proximal humerus which may be recent.  IMPRESSION: Interstitial edema superimposed on emphysematous change.  No airspace consolidation.  Recent appearing comminuted fracture of the proximal right humerus with avulsion of the greater tuberosity on the right as well as impaction at the metaphysis -diaphysis junction.   Electronically Signed   By: Bretta Bang M.D.   On: 03/03/2014 17:56    Scheduled Meds: . antiseptic oral rinse  15 mL Mouth Rinse q12n4p  . aspirin  81 mg Oral Daily  . calcitonin (salmon)  1 spray Alternating Nares Daily  . calcium-vitamin D  1 tablet Oral BID  . chlorhexidine  15 mL Mouth Rinse BID  . citalopram  20 mg Oral Daily  . docusate sodium  100 mg Oral BID  . dorzolamide-timolol  1 drop Both Eyes Daily  . enoxaparin (LOVENOX) injection  40 mg Subcutaneous Q24H  . fluticasone  1 puff Inhalation BID  . gabapentin  300 mg Oral BID  . latanoprost  1 drop Both Eyes QHS  .  multivitamin with minerals  1 tablet Oral Daily  . omega-3 acid ethyl esters  1 g Oral Daily  . pantoprazole  40 mg Oral Daily  . sodium chloride  3 mL Intravenous Q12H   Continuous Infusions:   Principal Problem:   Near syncope Active Problems:   HYPERCHOLESTEROLEMIA   HYPERLIPIDEMIA   HYPERTENSION   PEPTIC ULCER DISEASE   Shoulder fracture, right   Dizziness   Dehydration   Fall   Leukocytosis   Orthostasis    Time spent: 35 mins    Rodolph Bong MD [facility] Pager [phone]. If 7PM-7AM, please contact night-coverage at [email or web address], password [facility] 03/05/2014, 1:56 PM  LOS: 2 days

## 2014-03-05 NOTE — Progress Notes (Signed)
Orthopedic Tech Progress Note Patient Details:  Molly Cortez 06/20/1917 [id number] Arm sling replaced with shoulder immobilizer on RUE Ortho Devices Type of Ortho Device: Shoulder immobilizer Ortho Device/Splint Location: RUE Ortho Device/Splint Interventions: Application   Asia R Thompson 03/05/2014, 11:37 AM

## 2014-03-06 ENCOUNTER — Inpatient Hospital Stay (HOSPITAL_COMMUNITY): Payer: Medicare Other

## 2014-03-06 DIAGNOSIS — M25551 Pain in right hip: Secondary | ICD-10-CM | POA: Clinically undetermined

## 2014-03-06 DIAGNOSIS — S32591A Other specified fracture of right pubis, initial encounter for closed fracture: Secondary | ICD-10-CM | POA: Diagnosis present

## 2014-03-06 DIAGNOSIS — M25559 Pain in unspecified hip: Secondary | ICD-10-CM

## 2014-03-06 DIAGNOSIS — S32511A Fracture of superior rim of right pubis, initial encounter for closed fracture: Secondary | ICD-10-CM | POA: Diagnosis present

## 2014-03-06 LAB — URINALYSIS, ROUTINE W REFLEX MICROSCOPIC
BILIRUBIN URINE: NEGATIVE
GLUCOSE, UA: NEGATIVE mg/dL
Ketones, ur: 15 mg/dL — AB
Nitrite: NEGATIVE
Protein, ur: NEGATIVE mg/dL
SPECIFIC GRAVITY, URINE: 1.019 (ref 1.005–1.030)
UROBILINOGEN UA: 1 mg/dL (ref 0.0–1.0)
pH: 7 (ref 5.0–8.0)

## 2014-03-06 LAB — BASIC METABOLIC PANEL
BUN: 14 mg/dL (ref 6–23)
CHLORIDE: 94 meq/L — AB (ref 96–112)
CO2: 25 meq/L (ref 19–32)
CREATININE: 0.5 mg/dL (ref 0.50–1.10)
Calcium: 8.5 mg/dL (ref 8.4–10.5)
GFR calc Af Amer: >90 mL/min (ref >90)
GFR calc non Af Amer: 80 mL/min — ABNORMAL LOW (ref >90)
GLUCOSE: 109 mg/dL — AB (ref 70–99)
Potassium: 3.9 mEq/L (ref 3.7–5.3)
Sodium: 128 mEq/L — ABNORMAL LOW (ref 137–147)

## 2014-03-06 LAB — URINE MICROSCOPIC-ADD ON

## 2014-03-06 LAB — CREATININE, URINE, RANDOM: Creatinine, Urine: 72.5 mg/dL

## 2014-03-06 LAB — OSMOLALITY: Osmolality: 269 mOsm/kg — ABNORMAL LOW (ref 275–300)

## 2014-03-06 LAB — SODIUM, URINE, RANDOM

## 2014-03-06 MED ORDER — MECLIZINE HCL 12.5 MG PO TABS
12.5000 mg | ORAL_TABLET | Freq: Three times a day (TID) | ORAL | Status: DC | PRN
  Filled 2014-03-06: qty 1

## 2014-03-06 MED ORDER — ACETAMINOPHEN 500 MG PO TABS
500.0000 mg | ORAL_TABLET | Freq: Three times a day (TID) | ORAL | Status: DC
  Administered 2014-03-06 – 2014-03-09 (×9): 500 mg via ORAL
  Filled 2014-03-06 (×11): qty 1

## 2014-03-06 MED ORDER — SODIUM CHLORIDE 0.9 % IV SOLN
INTRAVENOUS | Status: DC
  Administered 2014-03-06 – 2014-03-08 (×4): via INTRAVENOUS

## 2014-03-06 NOTE — Progress Notes (Signed)
Pt requesting to get OOB. Pt states that she needs "2 physical therapist to get her up" and that nursing staff "just couldn't do that job.". Pt refusing to let nurse get her into chair. Pt educated and advised that physical therpay may not be able to come by today. Levonne Spiller, RN

## 2014-03-06 NOTE — Progress Notes (Signed)
[facility] PROGRESS NOTE  Molly Cortez ZYY:[id number] DOB: [birth year] DOA: 03/03/2014 PCP: Ginette Otto, MD  Assessment/Plan: #1 near-syncope/?vertigo Personal etiology. Likely secondary to orthostasis and possible vertigo. CT of the head is negative. 2-D echo negative for any valvular abnormalities. Carotid Dopplers with no significant ICA stenosis. Patient with interstitial edema noted on chest x-ray.  TED hose. Repeat orthostasis with resolution/resolved. Will place on meclizine as needed. Follow.  #2 orthostasis ?? Autonomic dysregulation. Blood pressure medications on hold. Normal saline lock IV fluids as chest x-ray with some interstitial edema. TED hose. Repeat orthostatics with improvement and seems resolved. Patient still with some dizziness. Will place on meclizine as needed.   #3 acute impacted subcapital fracture of the right humerus Secondary to mechanical fall. Case was discussed with orthopedics and recommending shoulder immobilizer, pain management, outpatient follow up with Dr Thomasena Edis in 1 week.  #4 right hip pain Patient was admitted with a full resulting in an impacted subcapital fracture of the right humerus. Will check plain films of the hips and pelvis. Supportive care. Pain management.  #5 hypertension BP meds on hold.  #6 leukocytosis UA is negative. Chest x-ray is negative. Likely reactive. No need for antibiotics at this time. Follow.  #7 history of peptic ulcer disease PPI.  #8 fall Likely secondary to problem #2. PT/OT.  #9 hyponatremia Likely secondary to hypovolemic hyponatremia. On admission patient was noted to be orthostatic. Patient also noted to have some interstitial edema and was given a single dose of IV Lasix. We'll place on gentle hydration. Check a urine sodium, urine creatinine, urine osmolality. Check a serum osmolality.  #10 glaucoma Continue eyedrops.  #11 prophylaxis PPI for GI prophylaxis Lovenox for DVT  prophylaxis.   Code Status: full Family Communication: updated patient and daughter in law at bedside. Disposition Plan: SNF when medically stable.   Consultants:  none  Procedures:  CT head 03/03/2014  2-D echo 03/04/2014  Chest x-ray 03/03/2014  X-ray of the right shoulder 03/03/2014, 03/04/2014   Carotid Dopplers 03/04/2014  Antibiotics:  none  HPI/Subjective: Patient c/o right shoulder pain. Patient also with some complaints of dizziness/spinning sensation. Patient c/o right hip pain.  Objective: Filed Vitals:   03/06/14 1420  BP: 131/54  Pulse: 87  Temp:   Resp: 19    Intake/Output Summary (Last 24 hours) at 03/06/14 1426 Last data filed at 03/06/14 0600  Gross per 24 hour  Intake    560 ml  Output      0 ml  Net    560 ml   Filed Weights   03/03/14 1308 03/05/14 0503  Weight: 55.566 kg (122 lb 8 oz) 55.43 kg (122 lb 3.2 oz)    Exam:   General:  NAD  Cardiovascular: RRR  Respiratory: CTAB  Abdomen: soft/nt/nd/+bs  Musculoskeletal: No c/c/e. RUE in shoulder immobilizer.  Data Reviewed: Basic Metabolic Panel:  Recent Labs Lab 03/03/14 1444 03/03/14 2058 03/04/14 0515 03/05/14 0350 03/06/14 0350  NA 140  --  137 131* 128*  K 4.4  --  3.7 3.4* 3.9  CL 101  --  101 96 94*  CO2 26  --  22 24 25   GLUCOSE 105*  --  110* 97 109*  BUN 10  --  15 19 14   CREATININE 0.51 0.53 0.52 0.56 0.50  CALCIUM 8.7  --  8.9 8.8 8.5  MG  --  1.6 1.8  --   --    Liver Function Tests:  Recent Labs Lab 03/04/14  0515  AST 25  ALT 14  ALKPHOS 62  BILITOT 0.8  PROT 6.3  ALBUMIN 3.0*   No results found for this basename: LIPASE, AMYLASE,  in the last 168 hours No results found for this basename: AMMONIA,  in the last 168 hours CBC:  Recent Labs Lab 03/03/14 1444 03/03/14 2058 03/04/14 0515 03/05/14 0350  WBC 13.7* 15.3* 13.0* 12.3*  NEUTROABS 11.0*  --   --   --   HGB 12.4 11.8* 10.9* 9.8*  HCT 37.2 34.6* 32.8* 29.4*  MCV 91.6 90.1  91.9 91.3  PLT 260 255 223 175   Cardiac Enzymes: No results found for this basename: CKTOTAL, CKMB, CKMBINDEX, TROPONINI,  in the last 168 hours BNP (last 3 results) No results found for this basename: PROBNP,  in the last 8760 hours CBG: No results found for this basename: GLUCAP,  in the last 168 hours  Recent Results (from the past 240 hour(s))  URINE CULTURE     Status: None   Collection Time    03/04/14 10:35 AM      Result Value Ref Range Status   Specimen Description URINE, RANDOM   Final   Special Requests NONE   Final   Culture  Setup Time     Final   Value: 03/04/2014 14:43     [facility]   [place] Count     Final   Value: 15,000 COLONIES/ML     [facility]   Culture     Final   Value: Multiple bacterial morphotypes present, none predominant. Suggest appropriate recollection if clinically indicated.     [facility]   Report Status 03/05/2014 FINAL   Final     Studies: No results found.  Scheduled Meds: . antiseptic oral rinse  15 mL Mouth Rinse q12n4p  . aspirin  81 mg Oral Daily  . calcitonin (salmon)  1 spray Alternating Nares Daily  . calcium-vitamin D  1 tablet Oral BID  . chlorhexidine  15 mL Mouth Rinse BID  . citalopram  20 mg Oral Daily  . docusate sodium  100 mg Oral BID  . dorzolamide-timolol  1 drop Both Eyes Daily  . enoxaparin (LOVENOX) injection  40 mg Subcutaneous Q24H  . fluticasone  1 puff Inhalation BID  . gabapentin  300 mg Oral BID  . latanoprost  1 drop Both Eyes QHS  . multivitamin with minerals  1 tablet Oral Daily  . omega-3 acid ethyl esters  1 g Oral Daily  . pantoprazole  40 mg Oral Daily  . sodium chloride  3 mL Intravenous Q12H   Continuous Infusions: . sodium chloride 50 mL/hr at 03/06/14 1418    Principal Problem:   Near syncope Active Problems:   HYPERCHOLESTEROLEMIA   HYPERLIPIDEMIA   HYPERTENSION   PEPTIC ULCER DISEASE   Hyponatremia   Shoulder  fracture, right   Dizziness   Dehydration   Fall   Leukocytosis   Orthostasis   Hip pain, right    Time spent: 35 mins    Rodolph Bonganiel V Sylvanus Telford MD [facility] Pager [phone]. If 7PM-7AM, please contact night-coverage at [email or web address], password [email or web address] 03/06/2014, 2:26 PM  LOS: 3 days

## 2014-03-06 NOTE — Clinical Social Work Placement (Addendum)
Clinical Social Work Department CLINICAL SOCIAL WORK PLACEMENT NOTE 03/06/2014  Patient:  Molly Cortez, Molly Cortez  Account Number:  [id number] Admit date:  03/03/2014  Clinical Social Worker:  Vivi Barrack, [place]  Date/time:  03/06/2014 05:28 PM  Clinical Social Work is seeking post-discharge placement for this patient at the following level of care:   SKILLED NURSING   (*CSW will update this form in Epic as items are completed)   03/04/2014  Patient/family provided with [facility] Department of Clinical Social Work's list of facilities offering this level of care within the geographic area requested by the patient (or if unable, by the patient's family).  03/04/2014  Patient/family informed of their freedom to choose among providers that offer the needed level of care, that participate in Medicare, Medicaid or managed care program needed by the patient, have an available bed and are willing to accept the patient.  03/04/2014  Patient/family informed of [facility]' ownership interest in [facility], as well as of the fact that they are under no obligation to receive care at this facility.  PASARR submitted to EDS on 03/06/2014 PASARR number received from EDS on 03/08/2014  FL2 transmitted to all facilities in geographic area requested by pt/family on  03/06/2014 FL2 transmitted to all facilities within larger geographic area on   Patient informed that his/her managed care company has contracts with or will negotiate with  certain facilities, including the following:     Patient/family informed of bed offers received:  03/08/2014 Patient chooses bed at [facility] Physician recommends and patient chooses bed at    Patient to be transferred to [facility] on  03/09/2014 Patient to be transferred to facility by [facility]  The following physician request were entered in Epic:   Additional Comments:  Vivi Barrack, LCSWA Weekend Clinical Social  Worker [phone]

## 2014-03-07 DIAGNOSIS — S32509A Unspecified fracture of unspecified pubis, initial encounter for closed fracture: Secondary | ICD-10-CM

## 2014-03-07 LAB — BASIC METABOLIC PANEL
BUN: 10 mg/dL (ref 6–23)
BUN: 8 mg/dL (ref 6–23)
CALCIUM: 8.7 mg/dL (ref 8.4–10.5)
CO2: 27 meq/L (ref 19–32)
CO2: 23 meq/L (ref 19–32)
CREATININE: 0.42 mg/dL — AB (ref 0.50–1.10)
Calcium: 8.3 mg/dL — ABNORMAL LOW (ref 8.4–10.5)
Chloride: 94 mEq/L — ABNORMAL LOW (ref 96–112)
Chloride: 96 mEq/L (ref 96–112)
Creatinine, Ser: 0.39 mg/dL — ABNORMAL LOW (ref 0.50–1.10)
GFR calc Af Amer: >90 mL/min (ref >90)
GFR calc Af Amer: >90 mL/min (ref >90)
GFR calc non Af Amer: 84 mL/min — ABNORMAL LOW (ref >90)
GFR, EST NON AFRICAN AMERICAN: 86 mL/min — AB (ref >90)
Glucose, Bld: 99 mg/dL (ref 70–99)
Glucose, Bld: 111 mg/dL — ABNORMAL HIGH (ref 70–99)
Potassium: 3.5 mEq/L — ABNORMAL LOW (ref 3.7–5.3)
Potassium: 4.4 mEq/L (ref 3.7–5.3)
Sodium: 128 mEq/L — ABNORMAL LOW (ref 137–147)
Sodium: 130 mEq/L — ABNORMAL LOW (ref 137–147)

## 2014-03-07 LAB — CBC WITH DIFFERENTIAL/PLATELET
BASOS ABS: 0 10*3/uL (ref 0.0–0.1)
Basophils Relative: 0 % (ref 0–1)
Eosinophils Absolute: 0.2 10*3/uL (ref 0.0–0.7)
Eosinophils Relative: 2 % (ref 0–5)
HEMATOCRIT: 25.7 % — AB (ref 36.0–46.0)
HEMOGLOBIN: 8.9 g/dL — AB (ref 12.0–15.0)
LYMPHS PCT: 16 % (ref 12–46)
Lymphs Abs: 2.2 10*3/uL (ref 0.7–4.0)
MCH: 30.9 pg (ref 26.0–34.0)
MCHC: 34.6 g/dL (ref 30.0–36.0)
MCV: 89.2 fL (ref 78.0–100.0)
MONO ABS: 2.9 10*3/uL — AB (ref 0.1–1.0)
Monocytes Relative: 21 % — ABNORMAL HIGH (ref 3–12)
NEUTROS ABS: 8.3 10*3/uL — AB (ref 1.7–7.7)
Neutrophils Relative %: 61 % (ref 43–77)
Platelets: 201 10*3/uL (ref 150–400)
RBC: 2.88 MIL/uL — ABNORMAL LOW (ref 3.87–5.11)
RDW: 13.9 % (ref 11.5–15.5)
WBC: 13.6 10*3/uL — ABNORMAL HIGH (ref 4.0–10.5)

## 2014-03-07 LAB — OSMOLALITY, URINE: OSMOLALITY UR: 418 mosm/kg (ref 390–1090)

## 2014-03-07 MED ORDER — POTASSIUM CHLORIDE CRYS ER 20 MEQ PO TBCR
40.0000 meq | EXTENDED_RELEASE_TABLET | Freq: Once | ORAL | Status: AC
  Administered 2014-03-07: 40 meq via ORAL
  Filled 2014-03-07: qty 2

## 2014-03-07 MED ORDER — CYCLOBENZAPRINE HCL 5 MG PO TABS
5.0000 mg | ORAL_TABLET | Freq: Three times a day (TID) | ORAL | Status: DC | PRN
  Administered 2014-03-07: 5 mg via ORAL
  Filled 2014-03-07: qty 1

## 2014-03-07 MED ORDER — TRAMADOL HCL 50 MG PO TABS
25.0000 mg | ORAL_TABLET | Freq: Four times a day (QID) | ORAL | Status: DC | PRN
  Administered 2014-03-07 – 2014-03-08 (×4): 25 mg via ORAL
  Filled 2014-03-07 (×4): qty 1

## 2014-03-07 NOTE — Progress Notes (Signed)
Occupational Therapy Treatment Patient Details Name: Molly Cortez MRN: [id number] DOB: 05/29/1917 Today's Date: 03/07/2014    History of present illness Pt with syncopal event at home landing on her right side with right humerus fx and R superior and inferior pubic ramus fractures.   OT comments  Pt limited by significant dizziness when sitting up and [facility]. Does improve some with increased time. She is also limited by R shoulder and R LE pain with activity. She requires max time to complete [facility] and has to take multiple rest breaks to prepare for next step of tasks as she appears to get anxious about moving/transferring. She will benefit from continued OT services to improve ADL independence.    Follow Up Recommendations  SNF;Supervision/Assistance - 24 hour    Equipment Recommendations  None recommended by OT    Recommendations for Other Services      Precautions / Restrictions Precautions Precautions: Fall;Shoulder Shoulder Interventions: Shoulder sling/immobilizer Required Braces or Orthoses: Sling Restrictions Weight Bearing Restrictions: Yes RUE Weight Bearing: Non weight bearing Other Position/Activity Restrictions: WBAT R LE       Mobility Bed Mobility   Bed Mobility: Supine to Sit Rolling: Mod assist Sidelying to sit: +2 for safety/equipment;Mod assist   Sit to supine: +2 for physical assistance;+2 for safety/equipment;Max assist      Transfers Overall transfer level: Needs assistance Equipment used: None Transfers: Sit to/from Stand;Stand Pivot Transfers Sit to Stand: +2 physical assistance;+2 safety/equipment;Mod assist Stand pivot transfers: +2 physical assistance;+2 safety/equipment;Mod assist       General transfer comment: assist to weight shift forward and to rise and steady. verbal cues for hand placement, increased time, assist to control descent.    Balance       Sitting balance - Comments: pt keeping eyes closed much of the  time sitting up. reports significant dizziness sitting EOB and on BSC. Min guard assist for sitting balance.                           ADL                           Toilet Transfer: +2 for physical assistance;+2 for safety/equipment;Stand-pivot;BSC;Moderate assistance             General ADL Comments: Pt reports feeling very dizzy with sitting up to EOB and also with pivot [facility]. BP supine 128/57 and sitting EOb 154/64. Note from PT notes, plan for vestibular assessment. Pt requires much increased time for all tasks and when asked to rate pain, she is unable to assign a number to her pain but does indicate pain with movement. She has a lot of difficulty with pivoting bed [facility] and back to bed due to R LE pain.       Vision                     Perception     Praxis      Cognition   Behavior During Therapy: Anxious Overall Cognitive Status: Impaired/Different from baseline Area of Impairment: Memory     Memory: Decreased short-term memory  Following Commands: Follows one step commands with increased time            Extremity/Trunk Assessment               Exercises     Shoulder Instructions  General Comments      Pertinent Vitals/ Pain       128/57 supine O2 100% on 2L at rest            154/64 sitting 92-93% on RA after activity; reapplied O2 at 1L per nursing request and O2 sats 94%. Pt not able to assign pain rating but states pain with movement R UE and R LE.  Home Living                                          Prior Functioning/Environment              Frequency Min 2X/week     Progress Toward Goals  OT Goals(current goals can now be found in the care plan section)  Progress towards OT goals: Not progressing toward goals - comment (limited by pain and dizziness)     Plan Discharge plan remains appropriate    Co-evaluation                 End of Session Equipment Utilized  During Treatment: Gait belt   Activity Tolerance Patient limited by pain;Other (comment) (dizziness)   Patient Left in bed;with call bell/phone within reach;with bed alarm set   Nurse Communication          Time: 5449-2010 OT Time Calculation (min): 60 min  Charges: OT General Charges $OT Visit: 1 Procedure OT Treatments $Therapeutic Activity: 53-67 mins  Sabino Gasser Hadyn Azer [phone] 03/07/2014, 11:24 AM

## 2014-03-07 NOTE — Progress Notes (Signed)
PT Cancellation Note  Patient Details Name: PAMEL WOODWARD MRN: [id number] DOB: 08/03/1917   Cancelled Treatment:    Reason Eval/Treat Not Completed: Fatigue/lethargy limiting ability to participate;Patient at procedure or test/unavailable.  Attempted to see patient x2.  Unavailable on first attempt, and patient/family declined on second.  Will return tomorrow for PT session.   Vena Austria 03/07/2014, 5:37 PM Durenda Hurt. Renaldo Fiddler, [facility] Acute Rehab Services Pager [phone]

## 2014-03-07 NOTE — Progress Notes (Signed)
[facility] PROGRESS NOTE  ALLINE ROTAN KGS:[id number] DOB: 1916/12/14 DOA: 03/03/2014 PCP: Ginette Otto, MD  Assessment/Plan: #1 near-syncope/?vertigo Personal etiology. Likely secondary to orthostasis and possible vertigo. CT of the head is negative. 2-D echo negative for any valvular abnormalities. Carotid Dopplers with no significant ICA stenosis. Patient with interstitial edema noted on chest x-ray.  TED hose. Repeat orthostasis with resolution/resolved. Continue meclizine as needed. Follow.  #2 orthostasis ?? Autonomic dysregulation. Blood pressure medications on hold. Normal saline lock IV fluids as chest x-ray with some interstitial edema. TED hose. Repeat orthostatics with improvement and seems resolved. Patient still with some dizziness. Continue meclizine as needed.   #3 acute impacted subcapital fracture of the right humerus Secondary to mechanical fall. Case was discussed with orthopedics and recommending shoulder immobilizer, pain management, outpatient follow up with Dr Thomasena Edis in 1 week.  #4 right hip pain/pelvic fracture/displaced right superior pubic rami fracture and mildly displaced right inferior pubic rami fracture Patient was admitted with a full resulting in an impacted subcapital fracture of the right humerus. X-rays consistent with a displaced pelvic fracture of the right superior pubic rami fracture and inferior pubic rami fracture. Patient currently on scheduled Tylenol. Ultram as needed. Patient has been seen by orthopedics and recommend weightbearing as tolerated, pain control and PT for gait training.   #5 hypertension BP meds on hold.  #6 leukocytosis UA is negative. Chest x-ray is negative. Likely reactive. No need for antibiotics at this time. Follow.  #7 history of peptic ulcer disease PPI.  #8 fall Likely secondary to problem #2. PT/OT.  #9 hyponatremia Likely secondary to hypovolemic hyponatremia. On admission patient was noted to be  orthostatic. Patient also noted to have some interstitial edema and was given a single dose of IV Lasix. Urine sodium was less than 20 patient looks clinically dry. Increase IV fluids to normal sinus 75 cc per hour and monitor volume status.  #10 glaucoma Continue eyedrops.  #11 prophylaxis PPI for GI prophylaxis Lovenox for DVT prophylaxis.   Code Status: full Family Communication: updated patient and daughter in law at bedside. Disposition Plan: SNF when medically stable.   Consultants:  Orthopedics: Dr Victorino Dike 03/06/14  Procedures:  CT head 03/03/2014  2-D echo 03/04/2014  Chest x-ray 03/03/2014  X-ray of the right shoulder 03/03/2014, 03/04/2014   Carotid Dopplers 03/04/2014  Xray bilateral hips 03/06/14  Antibiotics:  none  HPI/Subjective: Patient c/o right shoulder pain. Patient states some improvement with dizziness/spinning sensation. Patient c/o right hip pain.  Objective: Filed Vitals:   03/07/14 1000  BP: 154/64  Pulse:   Temp:   Resp:     Intake/Output Summary (Last 24 hours) at 03/07/14 1143 Last data filed at 03/07/14 0500  Gross per 24 hour  Intake    915 ml  Output    400 ml  Net    515 ml   Filed Weights   03/03/14 1308 03/05/14 0503  Weight: 55.566 kg (122 lb 8 oz) 55.43 kg (122 lb 3.2 oz)    Exam:   General:  NAD  Cardiovascular: RRR  Respiratory: CTAB  Abdomen: soft/nt/nd/+bs  Musculoskeletal: No c/c/e. RUE in shoulder immobilizer.  Data Reviewed: Basic Metabolic Panel:  Recent Labs Lab 03/03/14 1444 03/03/14 2058 03/04/14 0515 03/05/14 0350 03/06/14 0350 03/07/14 0300  NA 140  --  137 131* 128* 128*  K 4.4  --  3.7 3.4* 3.9 3.5*  CL 101  --  101 96 94* 94*  CO2 26  --  22 24 25 27   GLUCOSE 105*  --  110* 97 109* 99  BUN 10  --  15 19 14 10   CREATININE 0.51 0.53 0.52 0.56 0.50 0.42*  CALCIUM 8.7  --  8.9 8.8 8.5 8.3*  MG  --  1.6 1.8  --   --   --    Liver Function Tests:  Recent Labs Lab 03/04/14 0515   AST 25  ALT 14  ALKPHOS 62  BILITOT 0.8  PROT 6.3  ALBUMIN 3.0*   No results found for this basename: LIPASE, AMYLASE,  in the last 168 hours No results found for this basename: AMMONIA,  in the last 168 hours CBC:  Recent Labs Lab 03/03/14 1444 03/03/14 2058 03/04/14 0515 03/05/14 0350 03/07/14 0300  WBC 13.7* 15.3* 13.0* 12.3* 13.6*  NEUTROABS 11.0*  --   --   --  8.3*  HGB 12.4 11.8* 10.9* 9.8* 8.9*  HCT 37.2 34.6* 32.8* 29.4* 25.7*  MCV 91.6 90.1 91.9 91.3 89.2  PLT 260 255 223 175 201   Cardiac Enzymes: No results found for this basename: CKTOTAL, CKMB, CKMBINDEX, TROPONINI,  in the last 168 hours BNP (last 3 results) No results found for this basename: PROBNP,  in the last 8760 hours CBG: No results found for this basename: GLUCAP,  in the last 168 hours  Recent Results (from the past 240 hour(s))  URINE CULTURE     Status: None   Collection Time    03/04/14 10:35 AM      Result Value Ref Range Status   Specimen Description URINE, RANDOM   Final   Special Requests NONE   Final   Culture  Setup Time     Final   Value: 03/04/2014 14:43     Performed at [facility] Count     Final   Value: 15,000 COLONIES/ML     Performed at [facility]   Culture     Final   Value: Multiple bacterial morphotypes present, none predominant. Suggest appropriate recollection if clinically indicated.     Performed at [facility]   Report Status 03/05/2014 FINAL   Final     Studies: Dg Hip Bilateral W/pelvis  03/06/2014   CLINICAL DATA:  Bilateral hip pain post fall  EXAM: BILATERAL HIP WITH PELVIS - 4+ VIEW  COMPARISON:  None.  FINDINGS: Five views bilateral hip submitted. Bilateral hip joint is located. No femoral fracture. There is displaced fracture of the right superior pubic ramus. Mild displaced fracture of the right inferior pubic ramus. Study is limited by diffuse osteopenia.  IMPRESSION: No hip fracture. Displaced fracture of the right  superior pubic ramus. Mild displaced fracture of the right inferior pubic ramus. Limited study by diffuse osteopenia.   Electronically Signed   By: Natasha Mead M.D.   On: 03/06/2014 16:36    Scheduled Meds: . acetaminophen  500 mg Oral TID  . antiseptic oral rinse  15 mL Mouth Rinse q12n4p  . aspirin  81 mg Oral Daily  . calcitonin (salmon)  1 spray Alternating Nares Daily  . calcium-vitamin D  1 tablet Oral BID  . chlorhexidine  15 mL Mouth Rinse BID  . citalopram  20 mg Oral Daily  . docusate sodium  100 mg Oral BID  . dorzolamide-timolol  1 drop Both Eyes Daily  . enoxaparin (LOVENOX) injection  40 mg Subcutaneous Q24H  . fluticasone  1 puff Inhalation BID  . gabapentin  300  mg Oral BID  . latanoprost  1 drop Both Eyes QHS  . multivitamin with minerals  1 tablet Oral Daily  . omega-3 acid ethyl esters  1 g Oral Daily  . pantoprazole  40 mg Oral Daily  . sodium chloride  3 mL Intravenous Q12H   Continuous Infusions: . sodium chloride 75 mL/hr at 03/07/14 0906    Principal Problem:   Near syncope Active Problems:   HYPERCHOLESTEROLEMIA   HYPERLIPIDEMIA   HYPERTENSION   PEPTIC ULCER DISEASE   Hyponatremia   Shoulder fracture, right   Dizziness   Dehydration   Fall   Leukocytosis   Orthostasis   Hip pain, right   Fracture of right superior pubic ramus   Fracture of right inferior pubic ramus    Time spent: 35 mins    Rodolph Bonganiel V Thompson MD [facility] Pager [phone]. If 7PM-7AM, please contact night-coverage at [email or web address], password [email or web address] 03/07/2014, 11:43 AM  LOS: 4 days

## 2014-03-07 NOTE — Consult Note (Signed)
Reason for Consult:  Right shoulder and right hip pain Referring Physician: Dr. Cristal Deer is an 80 y.o. female.  HPI:  80 y/o female fell a few days ago after a near-syncopal episode at home.  C/o right shoulder pain in the ER.  Found to have a subcapital impacted humerus fracture.  She's been immobilized in a sling since admission.  She c/o aching pain in the shoulder that is mild at rest and more severe when she rolls over.  She also c/o right sided groin pain that is worse when rolling over on her side.  She has been able to transfer to a bedside commode but not yet walk.  She denies numbness, tingling or weakness in her upper or lower extremities.  No h/o previous right shoulder or hip injuyr or surgery.  She is not diabetic or a smoker.  Past Medical History  Diagnosis Date  . Hypertension   . Shortness of breath   . Depression   . GERD (gastroesophageal reflux disease)   . Anxiety   . Chronic cough     Followed by Keturah Barre   . Abnormal CXR 04/2010    LLL consolidation/scar; also with lung nodules followed by [facility] Stoneking  . PUD (peptic ulcer disease)   . Hyperlipidemia   . Glaucoma   . Osteoporosis   . Diastolic dysfunction   . Allergic rhinitis   . Asthma     "touch"  . Pneumonia 2012  . H/O hiatal hernia   . Arthritis     "knees; back" (03/03/2014)  . Compression fracture     "upper back"  . Shoulder fracture, right 03/03/2014    S/P mechanical fall    Past Surgical History  Procedure Laterality Date  . Breast biopsy Left 1989  . Tonsillectomy  1920's  . Hammer toe surgery Left   . Toe surgery Left     4th digit; "dr put a screw in it"  . Cataract extraction w/ intraocular lens  implant, bilateral Bilateral   . Dilation and curettage of uterus      Family History  Problem Relation Age of Onset  . Malignant hyperthermia Brother   . Asthma Brother   . Heart disease Brother     One had CABG, one deceased at 30 of MI  . Cancer Brother     One  had liver ca from EtOH, one had lung from smoking    Social History:  reports that she has never smoked. She has never used smokeless tobacco. She reports that she does not drink alcohol or use illicit drugs.  Allergies:  Allergies  Allergen Reactions  . Cefaclor Other (See Comments)    Unknown reaction   . Oxycodone Hcl Other (See Comments)    Loss of mental control    Medications: I have reviewed the patient's current medications.  Results for orders placed during the hospital encounter of 03/03/14 (from the past 48 hour(s))  BASIC METABOLIC PANEL     Status: Abnormal   Collection Time    03/05/14  3:50 AM      Result Value Ref Range   Sodium 131 (*) 137 - 147 mEq/L   Potassium 3.4 (*) 3.7 - 5.3 mEq/L   Chloride 96  96 - 112 mEq/L   CO2 24  19 - 32 mEq/L   Glucose, Bld 97  70 - 99 mg/dL   BUN 19  6 - 23 mg/dL   Creatinine, Ser 0.56  0.50 -  1.10 mg/dL   Calcium 8.8  8.4 - 10.5 mg/dL   GFR calc non Af Amer 77 (*) >90 mL/min   GFR calc Af Amer 89 (*) >90 mL/min   Comment: (NOTE)     The eGFR has been calculated using the CKD EPI equation.     This calculation has not been validated in all clinical situations.     eGFR's persistently <90 mL/min signify possible Chronic Kidney     Disease.  CBC     Status: Abnormal   Collection Time    03/05/14  3:50 AM      Result Value Ref Range   WBC 12.3 (*) 4.0 - 10.5 K/uL   RBC 3.22 (*) 3.87 - 5.11 MIL/uL   Hemoglobin 9.8 (*) 12.0 - 15.0 g/dL   HCT 29.4 (*) 36.0 - 46.0 %   MCV 91.3  78.0 - 100.0 fL   MCH 30.4  26.0 - 34.0 pg   MCHC 33.3  30.0 - 36.0 g/dL   RDW 14.5  11.5 - 15.5 %   Platelets 175  150 - 400 K/uL  BASIC METABOLIC PANEL     Status: Abnormal   Collection Time    03/06/14  3:50 AM      Result Value Ref Range   Sodium 128 (*) 137 - 147 mEq/L   Potassium 3.9  3.7 - 5.3 mEq/L   Chloride 94 (*) 96 - 112 mEq/L   CO2 25  19 - 32 mEq/L   Glucose, Bld 109 (*) 70 - 99 mg/dL   BUN 14  6 - 23 mg/dL   Creatinine, Ser 0.50   0.50 - 1.10 mg/dL   Calcium 8.5  8.4 - 10.5 mg/dL   GFR calc non Af Amer 80 (*) >90 mL/min   GFR calc Af Amer >90  >90 mL/min   Comment: (NOTE)     The eGFR has been calculated using the CKD EPI equation.     This calculation has not been validated in all clinical situations.     eGFR's persistently <90 mL/min signify possible Chronic Kidney     Disease.  OSMOLALITY     Status: Abnormal   Collection Time    03/06/14  3:40 PM      Result Value Ref Range   Osmolality 269 (*) 275 - 300 mOsm/kg   Comment: Performed at [facility] MICROSCOPIC     Status: Abnormal   Collection Time    03/06/14 10:23 PM      Result Value Ref Range   Color, Urine AMBER (*) YELLOW   Comment: BIOCHEMICALS MAY BE AFFECTED BY COLOR   APPearance CLOUDY (*) CLEAR   Specific Gravity, Urine 1.019  1.005 - 1.030   pH 7.0  5.0 - 8.0   Glucose, UA NEGATIVE  NEGATIVE mg/dL   Hgb urine dipstick SMALL (*) NEGATIVE   Bilirubin Urine NEGATIVE  NEGATIVE   Ketones, ur 15 (*) NEGATIVE mg/dL   Protein, ur NEGATIVE  NEGATIVE mg/dL   Urobilinogen, UA 1.0  0.0 - 1.0 mg/dL   Nitrite NEGATIVE  NEGATIVE   Leukocytes, UA TRACE (*) NEGATIVE  SODIUM, URINE, RANDOM     Status: None   Collection Time    03/06/14 10:23 PM      Result Value Ref Range   Sodium, Ur <20    CREATININE, URINE, RANDOM     Status: None   Collection Time    03/06/14 10:23 PM  Result Value Ref Range   Creatinine, Urine 72.50    URINE MICROSCOPIC-ADD ON     Status: None   Collection Time    03/06/14 10:23 PM      Result Value Ref Range   Squamous Epithelial / LPF RARE  RARE   WBC, UA 3-6  <3 WBC/hpf   RBC / HPF 7-10  <3 RBC/hpf   Bacteria, UA RARE  RARE    Dg Hip Bilateral W/pelvis  03/06/2014   CLINICAL DATA:  Bilateral hip pain post fall  EXAM: BILATERAL HIP WITH PELVIS - 4+ VIEW  COMPARISON:  None.  FINDINGS: Five views bilateral hip submitted. Bilateral hip joint is located. No femoral fracture.  There is displaced fracture of the right superior pubic ramus. Mild displaced fracture of the right inferior pubic ramus. Study is limited by diffuse osteopenia.  IMPRESSION: No hip fracture. Displaced fracture of the right superior pubic ramus. Mild displaced fracture of the right inferior pubic ramus. Limited study by diffuse osteopenia.   Electronically Signed   By: Lahoma Crocker M.D.   On: 03/06/2014 16:36   R shoulder films show a valgus impacted proximal humerus fracture.  ROS:  As above PE:  Blood pressure 124/51, pulse 79, temperature 97.6 F (36.4 C), temperature source Oral, resp. rate 18, height 4' 6"  (1.372 m), weight 55.43 kg (122 lb 3.2 oz), SpO2 100.00%. Elderly cachectic female in nad.  A and O x 3.  EOMI.  Resp unlabored.  Mood and affect normal.  R shoulder immobilized in a sling.  Skin intact but thin.  Mod swelling at the shoulder.  2+ radial pulse.  Feels LT normally in radial, ulnar and median nerve dist.  5/5 strength in radial, median and ulnar nerve dist.  No lymphadenopathy in R UE or LE.  No pain with log roll of hip.  TTP at sup pubic ramus on R.  Normal NV exam of R LE.  Assessment/Plan: R humerus fracture - this is a stable impacted fracture that can be treated successfully in closed fashion.  She'll be nwb on the R UE in a sling for comfort.  We'll plan to begin ROM in a couple of weeks when she f/u in clinic.  R superior and inferior pubic ramus fractures - these are also stable injuries that can be treated closed.  SHe can bear weight as tolerated.  Pain control PRN.  PT for gait training.  We'll follow with you.  Wylene Simmer 03/07/2014, 12:46 AM

## 2014-03-08 DIAGNOSIS — E871 Hypo-osmolality and hyponatremia: Secondary | ICD-10-CM

## 2014-03-08 LAB — CBC
HCT: 26.6 % — ABNORMAL LOW (ref 36.0–46.0)
HEMOGLOBIN: 9.1 g/dL — AB (ref 12.0–15.0)
MCH: 30.8 pg (ref 26.0–34.0)
MCHC: 34.2 g/dL (ref 30.0–36.0)
MCV: 90.2 fL (ref 78.0–100.0)
PLATELETS: 250 10*3/uL (ref 150–400)
RBC: 2.95 MIL/uL — AB (ref 3.87–5.11)
RDW: 14.3 % (ref 11.5–15.5)
WBC: 13 10*3/uL — ABNORMAL HIGH (ref 4.0–10.5)

## 2014-03-08 LAB — BASIC METABOLIC PANEL
BUN: 8 mg/dL (ref 6–23)
CO2: 25 mEq/L (ref 19–32)
Calcium: 8.5 mg/dL (ref 8.4–10.5)
Chloride: 99 mEq/L (ref 96–112)
Creatinine, Ser: 0.36 mg/dL — ABNORMAL LOW (ref 0.50–1.10)
GFR, EST NON AFRICAN AMERICAN: 89 mL/min — AB (ref >90)
Glucose, Bld: 89 mg/dL (ref 70–99)
Potassium: 4.9 mEq/L (ref 3.7–5.3)
SODIUM: 133 meq/L — AB (ref 137–147)

## 2014-03-08 MED ORDER — LIDOCAINE 5 % EX PTCH
1.0000 | MEDICATED_PATCH | CUTANEOUS | Status: DC
  Administered 2014-03-08 – 2014-03-09 (×2): 1 via TRANSDERMAL
  Filled 2014-03-08 (×2): qty 1

## 2014-03-08 NOTE — Care Management Note (Addendum)
    Page 1 of 1   03/09/2014     3:22:10 PM CARE MANAGEMENT NOTE 03/09/2014  Patient:  DAMIRA, ARDINGER   Account Number:  [id number]  Date Initiated:  03/08/2014  Documentation initiated by:  GRAVES-BIGELOW,Leiby Pigeon  Subjective/Objective Assessment:   Pt admitted for syncope- Fall/shoulder pain. Plan will be for SNF once medicaly stable for d/c.     Action/Plan:   CSW to assist with disposition needs.   Anticipated DC Date:  03/09/2014   Anticipated DC Plan:  SKILLED NURSING FACILITY  In-house referral  Clinical Social Worker      DC Planning Services  CM consult      Choice offered to / List presented to:             Status of service:  Completed, signed off Medicare Important Message given?  YES (If response is "NO", the following Medicare IM given date fields will be blank) Date Medicare IM given:  03/03/2014 Date Additional Medicare IM given:  03/09/2014  Discharge Disposition:  SKILLED NURSING FACILITY  Per UR Regulation:  Reviewed for med. necessity/level of care/duration of stay  If discussed at Long Length of Stay Meetings, dates discussed:   03/10/2014    Comments:

## 2014-03-08 NOTE — Progress Notes (Signed)
UR Completed Tomi Bamberger, RN,BSN [phone] f

## 2014-03-08 NOTE — Progress Notes (Signed)
CSW Proofreader) spoke with pt and pt daughter-in-law at bedside. They confirmed they would like to accept bed offer at [facility]. CSW contacted facility to notify of discharge tomorrow.  Hassell Patras, LCSWA [phone]

## 2014-03-08 NOTE — Progress Notes (Addendum)
[facility] PROGRESS NOTE  Molly Cortez ZOX:[id number] DOB: 02-Aug-1917 DOA: 03/03/2014 PCP: Ginette Otto, MD  Assessment/Plan: #1 near-syncope/?vertigo ??? etiology. Likely secondary to orthostasis and possible vertigo. CT of the head is negative. 2-D echo negative for any valvular abnormalities. Carotid Dopplers with no significant ICA stenosis. TED hose. Repeat orthostasis with resolution/resolved. Continue meclizine as needed. Follow.  #2 orthostasis ?? Autonomic dysregulation. Blood pressure medications on hold. Improved. TED hose. Repeat orthostatics with improvement and seems resolved. Continue meclizine as needed.   #3 acute impacted subcapital fracture of the right humerus Secondary to mechanical fall. Case was discussed with orthopedics and recommending shoulder immobilizer, pain management, outpatient follow up with Dr Collins/Dr Victorino Dike in 1 week. Add lidoderm patch.  #4 right hip pain/pelvic fracture/displaced right superior pubic rami fracture and mildly displaced right inferior pubic rami fracture Patient was admitted with a full resulting in an impacted subcapital fracture of the right humerus. X-rays consistent with a displaced pelvic fracture of the right superior pubic rami fracture and inferior pubic rami fracture. Patient currently on scheduled Tylenol. Ultram as needed. Patient has been seen by orthopedics and recommend weightbearing as tolerated, pain control and PT for gait training. Will add lidoderm patch.  #5 hypertension BP meds on hold.  #6 leukocytosis UA is negative. Chest x-ray is negative. Likely reactive. No need for antibiotics at this time. Follow.  #7 history of peptic ulcer disease PPI.  #8 fall Likely secondary to problem #2. PT/OT.  #9 hyponatremia Likely secondary to hypovolemic hyponatremia. On admission patient was noted to be orthostatic. Patient also noted to have some interstitial edema and was given a single dose of IV Lasix.  Urine sodium was less than 20 patient looks clinically dry. Hyponatremia improved. KVO IVF.  #10 glaucoma Continue eyedrops.  #11 prophylaxis PPI for GI prophylaxis Lovenox for DVT prophylaxis.   Code Status: full Family Communication: updated patient and daughter in law at bedside. Disposition Plan: SNF when medically stable, hopefully tomorrow.   Consultants:  Orthopedics: Dr Victorino Dike 03/06/14  Procedures:  CT head 03/03/2014  2-D echo 03/04/2014  Chest x-ray 03/03/2014  X-ray of the right shoulder 03/03/2014, 03/04/2014   Carotid Dopplers 03/04/2014  Xray bilateral hips 03/06/14  Antibiotics:  none  HPI/Subjective: Patient c/o right shoulder pain and hip pain. Patient states some improvement with dizziness/spinning sensation.   Objective: Filed Vitals:   03/08/14 0431  BP: 146/60  Pulse: 93  Temp: 97.7 F (36.5 C)  Resp: 18    Intake/Output Summary (Last 24 hours) at 03/08/14 0906 Last data filed at 03/08/14 8119  Gross per 24 hour  Intake    900 ml  Output      0 ml  Net    900 ml   Filed Weights   03/03/14 1308 03/05/14 0503 03/08/14 0431  Weight: 55.566 kg (122 lb 8 oz) 55.43 kg (122 lb 3.2 oz) 54.613 kg (120 lb 6.4 oz)    Exam:   General:  NAD  Cardiovascular: RRR  Respiratory: CTAB  Abdomen: soft/nt/nd/+bs  Musculoskeletal: No c/c/e. RUE in shoulder immobilizer.  Data Reviewed: Basic Metabolic Panel:  Recent Labs Lab 03/03/14 1444 03/03/14 2058 03/04/14 0515 03/05/14 0350 03/06/14 0350 03/07/14 0300 03/07/14 1610 03/08/14 0540  NA 140  --  137 131* 128* 128* 130* 133*  K 4.4  --  3.7 3.4* 3.9 3.5* 4.4 4.9  CL 101  --  101 96 94* 94* 96 99  CO2 26  --  22 24 25  27  23 25  GLUCOSE 105*  --  110* 97 109* 99 111* 89  BUN 10  --  15 19 14 10 8 8   CREATININE 0.51 0.53 0.52 0.56 0.50 0.42* 0.39* 0.36*  CALCIUM 8.7  --  8.9 8.8 8.5 8.3* 8.7 8.5  MG  --  1.6 1.8  --   --   --   --   --    Liver Function Tests:  Recent  Labs Lab 03/04/14 0515  AST 25  ALT 14  ALKPHOS 62  BILITOT 0.8  PROT 6.3  ALBUMIN 3.0*   No results found for this basename: LIPASE, AMYLASE,  in the last 168 hours No results found for this basename: AMMONIA,  in the last 168 hours CBC:  Recent Labs Lab 03/03/14 1444 03/03/14 2058 03/04/14 0515 03/05/14 0350 03/07/14 0300 03/08/14 0540  WBC 13.7* 15.3* 13.0* 12.3* 13.6* 13.0*  NEUTROABS 11.0*  --   --   --  8.3*  --   HGB 12.4 11.8* 10.9* 9.8* 8.9* 9.1*  HCT 37.2 34.6* 32.8* 29.4* 25.7* 26.6*  MCV 91.6 90.1 91.9 91.3 89.2 90.2  PLT 260 255 223 175 201 250   Cardiac Enzymes: No results found for this basename: CKTOTAL, CKMB, CKMBINDEX, TROPONINI,  in the last 168 hours BNP (last 3 results) No results found for this basename: PROBNP,  in the last 8760 hours CBG: No results found for this basename: GLUCAP,  in the last 168 hours  Recent Results (from the past 240 hour(s))  URINE CULTURE     Status: None   Collection Time    03/04/14 10:35 AM      Result Value Ref Range Status   Specimen Description URINE, RANDOM   Final   Special Requests NONE   Final   Culture  Setup Time     Final   Value: 03/04/2014 14:43     Performed at [facility] Count     Final   Value: 15,000 COLONIES/ML     Performed at [facility]   Culture     Final   Value: Multiple bacterial morphotypes present, none predominant. Suggest appropriate recollection if clinically indicated.     Performed at [facility]   Report Status 03/05/2014 FINAL   Final     Studies: Dg Hip Bilateral W/pelvis  03/06/2014   CLINICAL DATA:  Bilateral hip pain post fall  EXAM: BILATERAL HIP WITH PELVIS - 4+ VIEW  COMPARISON:  None.  FINDINGS: Five views bilateral hip submitted. Bilateral hip joint is located. No femoral fracture. There is displaced fracture of the right superior pubic ramus. Mild displaced fracture of the right inferior pubic ramus. Study is limited by diffuse  osteopenia.  IMPRESSION: No hip fracture. Displaced fracture of the right superior pubic ramus. Mild displaced fracture of the right inferior pubic ramus. Limited study by diffuse osteopenia.   Electronically Signed   By: Natasha Mead M.D.   On: 03/06/2014 16:36    Scheduled Meds: . acetaminophen  500 mg Oral TID  . antiseptic oral rinse  15 mL Mouth Rinse q12n4p  . aspirin  81 mg Oral Daily  . calcitonin (salmon)  1 spray Alternating Nares Daily  . calcium-vitamin D  1 tablet Oral BID  . chlorhexidine  15 mL Mouth Rinse BID  . citalopram  20 mg Oral Daily  . docusate sodium  100 mg Oral BID  . dorzolamide-timolol  1 drop Both Eyes Daily  .  enoxaparin (LOVENOX) injection  40 mg Subcutaneous Q24H  . fluticasone  1 puff Inhalation BID  . gabapentin  300 mg Oral BID  . latanoprost  1 drop Both Eyes QHS  . multivitamin with minerals  1 tablet Oral Daily  . omega-3 acid ethyl esters  1 g Oral Daily  . pantoprazole  40 mg Oral Daily  . sodium chloride  3 mL Intravenous Q12H   Continuous Infusions: . sodium chloride 75 mL/hr at 03/08/14 [id number]    Principal Problem:   Near syncope Active Problems:   HYPERCHOLESTEROLEMIA   HYPERLIPIDEMIA   HYPERTENSION   PEPTIC ULCER DISEASE   Hyponatremia   Shoulder fracture, right   Dizziness   Dehydration   Fall   Leukocytosis   Orthostasis   Hip pain, right   Fracture of right superior pubic ramus   Fracture of right inferior pubic ramus    Time spent: 35 mins    Rodolph Bonganiel V Thompson MD [facility] Pager [phone]. If 7PM-7AM, please contact night-coverage at [email or web address], password [email or web address] 03/08/2014, 9:06 AM  LOS: 5 days

## 2014-03-08 NOTE — Progress Notes (Addendum)
Physical Therapy Treatment Patient Details Name: Molly Cortez MRN: [id number] DOB: [birth year] Today's Date: 03/08/2014    History of Present Illness Pt with syncopal event at home landing on her right side with right humerus fx and R superior and inferior pubic ramus fractures.    PT Comments    Pt admitted with above. Pt currently with functional limitations due to balance and endurance deficits. Continues to have fluctuations in BP.   Pt will benefit from skilled PT to increase their independence and safety with mobility to allow discharge to the venue listed below.   Follow Up Recommendations  SNF;Supervision/Assistance - 24 hour     Equipment Recommendations  None recommended by PT    Recommendations for Other Services OT consult     Precautions / Restrictions Precautions Precautions: Fall;Shoulder Shoulder Interventions: Shoulder sling/immobilizer Required Braces or Orthoses: Sling Restrictions Weight Bearing Restrictions: Yes RUE Weight Bearing: Non weight bearing Other Position/Activity Restrictions: WBAT R LE    Mobility  Bed Mobility Overal bed mobility: Needs Assistance Bed Mobility: Supine to Sit Rolling: Mod assist Sidelying to sit: Mod assist       General bed mobility comments: cues for sequence and use of bed pad to roll pt to left and assst to bring legs off bed and elevate trunk.    Transfers Overall transfer level: Needs assistance Equipment used: None Transfers: Sit to/from [employer] Sit to Stand: Mod assist Stand pivot transfers: Mod assist       General transfer comment: max cues for safety, precautions and sequence with assit for forward weight shift.  Assisted pt to 3N1 and then to recliner.  Took incr time and needed assist to weight shift.  Maintains flexion at hips, knees and trunk.  NT came in to clean pt after she had large BM in 3N1.    Ambulation/Gait                 Stairs            Wheelchair  Mobility    Modified Rankin (Stroke Patients Only)       Balance Overall balance assessment: Needs assistance Sitting-balance support: Feet supported;Single extremity supported Sitting balance-Leahy Scale: Fair Sitting balance - Comments: pt keeping eyes closed some of the time sitting up. Min guard assist for sitting balance.  Pt reports lightheadedness and not spinning.  BP continues to fluctuate with position changes.  Postural control: Posterior lean Standing balance support: Single extremity supported;During functional activity Standing balance-Leahy Scale: Poor Standing balance comment: Requires mod assist to maintain standing.                     Cognition Arousal/Alertness: Awake/alert Behavior During Therapy: Anxious Overall Cognitive Status: Impaired/Different from baseline Area of Impairment: Memory Orientation Level: Disoriented to;Time   Memory: Decreased short-term memory Following Commands: Follows one step commands with increased time            Exercises  HS x 10, QS x 10    General Comments General comments (skin integrity, edema, etc.): Pt negative for all tests for BPPV to include Hallpike and supine head roll test.  Could not elicit nystagmus or dizziness.  Pt does make it difficult as she does not always keep eyes open.  Had more difficulty with gaze stability on right than left but appears functional.  No vestibular findings on exam today.       Pertinent Vitals/Pain O2 sats on RA 88% replaced O2 at 2LO2  and it improved to 94%.   Orthostatic BPs  Supine 151/65, 102 bpm  Sitting 137/84, 103 bpm  Standing Unable to take while standing  Sitting after 5 min 150/88, 96 bpm  After last transfer to chair 114/82, 111 bpm   BP fluctuating throughout session.  Pt lightheaded as well.      Home Living                      Prior Function            PT Goals (current goals can now be found in the care plan section) Progress towards  PT goals: Progressing toward goals    Frequency  Min 3X/week    PT Plan Current plan remains appropriate    Co-evaluation             End of Session Equipment Utilized During Treatment: Gait belt;Other (comment);Oxygen (right UE sling) Activity Tolerance: Patient limited by fatigue Patient left: in chair;with call bell/phone within reach     Time: 0910-0944 PT Time Calculation (min): 34 min  Charges:  $Therapeutic Activity: 8-22 mins $Self Care/Home Management: 8-22                    G Codes:      Barb Merinoawn Ingold 03/08/2014, 11:28 AM  Audree Camelawn Ingold,PT Acute Rehabilitation [phone])

## 2014-03-09 MED ORDER — DSS 100 MG PO CAPS: 100.0000 mg | ORAL_CAPSULE | Freq: Two times a day (BID) | ORAL | Status: AC

## 2014-03-09 MED ORDER — ACETAMINOPHEN-CODEINE #3 300-30 MG PO TABS: 1.0000 | ORAL_TABLET | Freq: Four times a day (QID) | ORAL | Status: AC | PRN

## 2014-03-09 MED ORDER — CITALOPRAM HYDROBROMIDE 20 MG PO TABS: 20.0000 mg | ORAL_TABLET | Freq: Every day | ORAL | Status: DC

## 2014-03-09 MED ORDER — LORAZEPAM 0.5 MG PO TABS: 0.5000 mg | ORAL_TABLET | Freq: Two times a day (BID) | ORAL | Status: DC | PRN

## 2014-03-09 MED ORDER — TRAMADOL HCL 50 MG PO TABS: 25.0000 mg | ORAL_TABLET | Freq: Four times a day (QID) | ORAL | Status: DC | PRN

## 2014-03-09 MED ORDER — MECLIZINE HCL 12.5 MG PO TABS: 12.5000 mg | ORAL_TABLET | Freq: Three times a day (TID) | ORAL | Status: AC | PRN

## 2014-03-09 MED ORDER — LIDOCAINE 5 % EX PTCH: 1.0000 | MEDICATED_PATCH | CUTANEOUS | Status: DC

## 2014-03-09 NOTE — Discharge Summary (Signed)
Physician Discharge Summary  Molly Cortez:[id number] DOB: 1917/10/03 DOA: 03/03/2014  PCP: Ginette Otto, MD  Admit date: 03/03/2014 Discharge date: 03/09/2014  Time spent: 65 minutes  Recommendations for Outpatient Follow-up:  1. Followup with Dr. Hermina Barters. Victorino Dike in 1 week for followup on an impacted right shoulder fracture and pelvic fractures. 2. Followup with Ginette Otto, MD A1 to 2 weeks. On followup this is orthostasis on to be reassessed. Patient's blood pressure medications were discontinued. Patient will need a basic metabolic profile done to followup on electrolytes and renal function. 3. Followup with M.D. at the skilled nursing facility.  Discharge Diagnoses:  Principal Problem:   Near syncope Active Problems:   Shoulder fracture, right   Fracture of right superior pubic ramus   Fracture of right inferior pubic ramus   HYPERCHOLESTEROLEMIA   HYPERLIPIDEMIA   HYPERTENSION   PEPTIC ULCER DISEASE   Hyponatremia   Dizziness   Dehydration   Fall   Leukocytosis   Orthostasis   Hip pain, right   Discharge Condition: Stable and improved  Diet recommendation: Regular  Filed Weights   03/05/14 0503 03/08/14 0431 03/09/14 0500  Weight: 55.43 kg (122 lb 3.2 oz) 54.613 kg (120 lb 6.4 oz) 54.456 kg (120 lb 0.9 oz)    History of present illness:  Molly Cortez is a 80 y.o. female  With history of hypertension, depression, gastroesophageal reflux disease, chronic cough, peptic ulcer disease, hyperlipidemia who presents to the ED after a fall with complaints of right shoulder.  Patient stated that she woke up this morning and felt dizzy but did not feel like there was a spinning. Patient stated that she be and was unsteady and subsequently put on a close. Patient stated that she was seen at the foot of the bed with a walker in front and that she was going to get up to stand to go to dictation she felt like she was going to fall tried to grab on the  walker and subsequently fell on her right shoulder and right hip. Patient stated that at that time her daughter-in-law was there and subsequently called 911. Patient denied a spinning sensation around her. Patient denies any syncope. Patient denies any chest pain. No shortness of breath. No abdominal pain. No diarrhea. No fever, no chills, no nausea, no vomiting, no change in chronic cough. No weakness. Patient does and also occasional constipation. Patient also with some questionable dysuria.  Patient was seen in the emergency room was felt patient had vertigo. Plain films of the right shoulder which were done showed an acute impacted subcapital fracture of the right humerus with fracture involving the greater tuberosity. Basic metabolic profile which was done was unremarkable. CBC which was done had a white count of 13.7 with a left shift. EKG showed a normal sinus rhythm. Poor R-wave progression.  It was noted that on presentation to the ED in June her stay in the ED patient's systolic blood pressure dropped to 88. Urinalysis was not done. Chest x-ray was not done. CT of the head was not done.  [facility] have been consulted, Dr. Farris Has who will assess the patient.  We were called to admit the patient for further evaluation and management.   Hospital Course:  #1 near-syncope/?vertigo  ??? etiology. Likely secondary to orthostasis and possible vertigo. CT of the head is negative. 2-D echo negative for any valvular abnormalities. Carotid Dopplers with no significant ICA stenosis. TED hose. Repeat orthostasis with resolution after holding pressure medications  and hydration with IV fluids. Patient was also placed on meclizine as needed. Patient did not have any further episodes and will be discharged in stable and improved condition.  #2 orthostasis  ?? Autonomic dysregulation. Blood pressure medications were held. Patient was hydrated with IV fluids and was euvolemic. Patient was also placed on  TED hose. Repeat orthostatics with improvement and resolution. Patient be discharged off her antihypertensive medications.  #3 acute impacted subcapital fracture of the right humerus  Secondary to mechanical fall. Case was discussed with orthopedics and recommending shoulder immobilizer, pain management, outpatient follow up with Dr Collins/Dr Victorino Dike in 1 week. Patient was placed on a Lidoderm patch with better pain control and Ultram as well as Tylenol #3 were used for breakthrough pain. Patient will followup with orthopedics and PCP as outpatient. #4 right hip pain/pelvic fracture/displaced right superior pubic rami fracture and mildly displaced right inferior pubic rami fracture  Patient was admitted with a fall resulting in an impacted subcapital fracture of the right humerus. X-rays consistent with a displaced pelvic fracture of the right superior pubic rami fracture and inferior pubic rami fracture. Patient was initially placed on scheduled Tylenol as well as Ultram as needed. Patient's pain was somewhat controlled. Patient was subsequently placed on a Lidoderm patch with improved pain management.  Patient has been seen by orthopedics, Dr. Victorino Dike and recommend weightbearing as tolerated, pain control and PT for gait training.  #5 hypertension  BP meds on hold secondary to orthostasis. Patient's blood pressure regimen was not resumed on discharge. Patient's blood pressure remained stable during the hospitalization and she'll followup with PCP as outpatient. On day of discharge patient's blood pressure was 126/51. #6 leukocytosis  UA is negative. Chest x-ray is negative. Likely reactive. No need for antibiotics at this time. Follow.  #7 history of peptic ulcer disease  Patient was maintained on a PPI.  #8 fall  Likely secondary to problem #2. PT/OT.  #9 hyponatremia  Likely secondary to hypovolemic hyponatremia. On admission patient was noted to be orthostatic. Patient also noted to have some  interstitial edema and was given a single dose of IV Lasix. Urine sodium was less than 20 patient looked clinically dry. Patient was hydrated gently with IV fluids with improvement in her hyponatremia. Patient will followup as outpatient.  #10 glaucoma  Continued on home regimen of eyedrops.      Procedures: CT head 03/03/2014  2-D echo 03/04/2014  Chest x-ray 03/03/2014  X-ray of the right shoulder 03/03/2014, 03/04/2014  Carotid Dopplers 03/04/2014  Xray bilateral hips 03/06/14   Consultations: Orthopedics: Dr Victorino Dike 03/06/14   Discharge Exam: Filed Vitals:   03/09/14 0500  BP: 126/51  Pulse: 77  Temp: 98 F (36.7 C)  Resp: 18    General: NAD Cardiovascular: RRR Respiratory: CTAB  Discharge Instructions You were cared for by a hospitalist during your hospital stay. If you have any questions about your discharge medications or the care you received while you were in the hospital after you are discharged, you can call the unit and asked to speak with the hospitalist on call if the hospitalist that took care of you is not available. Once you are discharged, your primary care physician will handle any further medical issues. Please note that NO REFILLS for any discharge medications will be authorized once you are discharged, as it is imperative that you return to your primary care physician (or establish a relationship with a primary care physician if you do not have one) for  your aftercare needs so that they can reassess your need for medications and monitor your lab values.      Discharge Instructions   Diet general    Complete by:  As directed      Discharge instructions    Complete by:  As directed   [facility] Dr Collins[facility]. Follow up with Ginette OttoSTONEKING,HAL THOMAS, MD in 2 weeks. Follow up with MD at SNF.     Increase activity slowly    Complete by:  As directed   Nonweightbearing right upper extremity. Keep right upper  extremity in a shoulder immobilizer. Weight-bearing as tolerated bilateral lower extremities.            Medication List    STOP taking these medications       amLODipine 5 MG tablet  Commonly known as:  NORVASC     hydrochlorothiazide 12.5 MG capsule  Commonly known as:  MICROZIDE      TAKE these medications       acetaminophen-codeine 300-30 MG per tablet  Commonly known as:  TYLENOL #3  - Take 1 tablet by mouth at bedtime. For pain  -      acetaminophen-codeine 300-30 MG per tablet  Commonly known as:  TYLENOL #3  Take 1 tablet by mouth every 6 (six) hours as needed for moderate pain.     albuterol 108 (90 BASE) MCG/ACT inhaler  Commonly known as:  PROVENTIL HFA;VENTOLIN HFA  Inhale 2 puffs into the lungs every 4 (four) hours as needed. For wheezing     aspirin 81 MG tablet  Take 81 mg by mouth daily.     beclomethasone 80 MCG/ACT inhaler  Commonly known as:  QVAR  Inhale 1 puff into the lungs 2 (two) times daily.     bimatoprost 0.03 % ophthalmic solution  Commonly known as:  LUMIGAN  Place 1 drop into both eyes at bedtime.     calcitonin (salmon) 200 UNIT/ACT nasal spray  Commonly known as:  MIACALCIN/FORTICAL  Place 1 spray into alternate nostrils daily.     CALCIUM 600+D 600-200 MG-UNIT Tabs  Generic drug:  Calcium Carbonate-Vitamin D  Take 1 tablet by mouth 2 (two) times daily.     citalopram 20 MG tablet  Commonly known as:  CELEXA  Take 1 tablet (20 mg total) by mouth daily.     dorzolamide-timolol 22.3-6.8 MG/ML ophthalmic solution  Commonly known as:  COSOPT  Place 1 drop into both eyes daily.     DSS 100 MG Caps  Take 100 mg by mouth 2 (two) times daily.     fish oil-omega-3 fatty acids 1000 MG capsule  Take 1 g by mouth daily.     gabapentin 300 MG capsule  Commonly known as:  NEURONTIN  Take 300 mg by mouth 2 (two) times daily.     lidocaine 5 %  Commonly known as:  LIDODERM  Place 1 patch onto the skin daily. Remove & Discard  patch within 12 hours or as directed by MD     LORazepam 0.5 MG tablet  Commonly known as:  ATIVAN  Take 1 tablet (0.5 mg total) by mouth 2 (two) times daily as needed for anxiety.     meclizine 12.5 MG tablet  Commonly known as:  ANTIVERT  Take 1 tablet (12.5 mg total) by mouth 3 (three) times daily as needed for dizziness.     multivitamin with minerals Tabs tablet  Take 1 tablet  by mouth daily.     traMADol 50 MG tablet  Commonly known as:  ULTRAM  Take 0.5 tablets (25 mg total) by mouth every 6 (six) hours as needed for moderate pain.       Allergies  Allergen Reactions  . Cefaclor Other (See Comments)    Unknown reaction   . Oxycodone Hcl Other (See Comments)    Loss of mental control   Follow-up Information   Follow up with Erasmo Leventhal, MD. Schedule an appointment as soon as possible for a visit in 1 week.   Specialty:  Orthopedic Surgery   Contact information:   [address] [place] [address] [phone]       Follow up with Ginette Otto, MD. Schedule an appointment as soon as possible for a visit in 2 weeks. (f/u in 1-2 weeks.)    Specialty:  Internal Medicine   Contact information:   [address] [place] [address] [phone]       Please follow up. (f/u with MD at SNF.)        The results of significant diagnostics from this hospitalization (including imaging, microbiology, ancillary and laboratory) are listed below for reference.    Significant Diagnostic Studies: Dg Shoulder Right  03/04/2014   CLINICAL DATA:  Fracture of the proximal right humerus.  EXAM: RIGHT SHOULDER - 2+ VIEW  COMPARISON:  03/03/2014  FINDINGS: The humeral head is located. Negative for dislocation. Bones are osteopenic. Again seen is an impacted subcapital humeral neck fracture. The fracture involves the greater tuberosity. No significant change compared to radiographs 03/03/2014. The acromioclavicular joint is aligned.  No additional fracture site identified.  IMPRESSION: No significant change in impacted subcapital fracture of the right humerus and fracture of the greater tuberosity.   Electronically Signed   By: Britta Mccreedy M.D.   On: 03/04/2014 14:12   Dg Shoulder Right  03/03/2014   CLINICAL DATA:  Near syncopal episode status post fall now with right shoulder pain  EXAM: RIGHT SHOULDER - 2+ VIEW  COMPARISON:  None.  FINDINGS: The patient has sustained an impacted subcapital fracture of the right humeral neck with fracture of the greater tuberosity. The glenohumeral joint is intact. The [facility] joint is intact. The scapula exhibits no acute abnormality. The observed portions of the right clavicle and upper right ribs appear normal.  IMPRESSION: There is an acute impacted subcapital fracture of the right humerus with fracture involving the greater tuberosity.   Electronically Signed   By: David  [place]   On: 03/03/2014 14:28   Dg Hip Bilateral W/pelvis  03/06/2014   CLINICAL DATA:  Bilateral hip pain post fall  EXAM: BILATERAL HIP WITH PELVIS - 4+ VIEW  COMPARISON:  None.  FINDINGS: Five views bilateral hip submitted. Bilateral hip joint is located. No femoral fracture. There is displaced fracture of the right superior pubic ramus. Mild displaced fracture of the right inferior pubic ramus. Study is limited by diffuse osteopenia.  IMPRESSION: No hip fracture. Displaced fracture of the right superior pubic ramus. Mild displaced fracture of the right inferior pubic ramus. Limited study by diffuse osteopenia.   Electronically Signed   By: Natasha Mead M.D.   On: 03/06/2014 16:36   Ct Head Wo Contrast  03/03/2014   CLINICAL DATA:  Larey Seat and hit her head this morning.  EXAM: CT HEAD WITHOUT CONTRAST  TECHNIQUE: Contiguous axial images were obtained from the base of the skull through the vertex without intravenous contrast.  COMPARISON:  None.  FINDINGS: Diffusely enlarged ventricles and subarachnoid spaces. Patchy white matter low  density in both cerebral hemispheres. No skull fracture, intracranial hemorrhage or paranasal sinus air-fluid levels. Completely opacified sphenoid sinus bilaterally. Bilateral cavernous internal carotid artery atheromatous calcifications and bilateral vertebral and basilar artery atheromatous calcifications.  IMPRESSION: 1. No acute abnormality. 2. Atrophy and chronic small vessel white matter ischemic changes. 3. Chronic sphenoid sinusitis.   Electronically Signed   By: Gordan Payment M.D.   On: 03/03/2014 19:50   Dg Chest Port 1 View  03/03/2014   CLINICAL DATA:  Leukocytosis ; difficulty breathing  EXAM: PORTABLE CHEST - 1 VIEW  COMPARISON:  Chest radiograph and chest CT November 21, 2012  FINDINGS: There is underlying emphysematous change. There is generalized interstitial prominence which appears more pronounced than on prior study. There is no frank consolidation. The heart size is within normal limits. The pulmonary vascularity reflects underlying emphysema. There is atherosclerotic change in aorta. No adenopathy. There is evidence of a comminuted fracture of the right proximal humerus which may be recent.  IMPRESSION: Interstitial edema superimposed on emphysematous change.  No airspace consolidation.  Recent appearing comminuted fracture of the proximal right humerus with avulsion of the greater tuberosity on the right as well as impaction at the metaphysis -diaphysis junction.   Electronically Signed   By: Bretta Bang M.D.   On: 03/03/2014 17:56    Microbiology: Recent Results (from the past 240 hour(s))  URINE CULTURE     Status: None   Collection Time    03/04/14 10:35 AM      Result Value Ref Range Status   Specimen Description URINE, RANDOM   Final   Special Requests NONE   Final   Culture  Setup Time     Final   Value: 03/04/2014 14:43     Performed at [facility] Count     Final   Value: 15,000 COLONIES/ML     Performed at [facility]   Culture      Final   Value: Multiple bacterial morphotypes present, none predominant. Suggest appropriate recollection if clinically indicated.     Performed at [facility]   Report Status 03/05/2014 FINAL   Final     Labs: Basic Metabolic Panel:  Recent Labs Lab 03/03/14 1444 03/03/14 2058 03/04/14 0515 03/05/14 0350 03/06/14 0350 03/07/14 0300 03/07/14 1610 03/08/14 0540  NA 140  --  137 131* 128* 128* 130* 133*  K 4.4  --  3.7 3.4* 3.9 3.5* 4.4 4.9  CL 101  --  101 96 94* 94* 96 99  CO2 26  --  22 24 25 27 23 25   GLUCOSE 105*  --  110* 97 109* 99 111* 89  BUN 10  --  15 19 14 10 8 8   CREATININE 0.51 0.53 0.52 0.56 0.50 0.42* 0.39* 0.36*  CALCIUM 8.7  --  8.9 8.8 8.5 8.3* 8.7 8.5  MG  --  1.6 1.8  --   --   --   --   --    Liver Function Tests:  Recent Labs Lab 03/04/14 0515  AST 25  ALT 14  ALKPHOS 62  BILITOT 0.8  PROT 6.3  ALBUMIN 3.0*   No results found for this basename: LIPASE, AMYLASE,  in the last 168 hours No results found for this basename: AMMONIA,  in the last 168 hours CBC:  Recent Labs Lab 03/03/14 1444 03/03/14 2058 03/04/14  0515 03/05/14 0350 03/07/14 0300 03/08/14 0540  WBC 13.7* 15.3* 13.0* 12.3* 13.6* 13.0*  NEUTROABS 11.0*  --   --   --  8.3*  --   HGB 12.4 11.8* 10.9* 9.8* 8.9* 9.1*  HCT 37.2 34.6* 32.8* 29.4* 25.7* 26.6*  MCV 91.6 90.1 91.9 91.3 89.2 90.2  PLT 260 255 223 175 201 250   Cardiac Enzymes: No results found for this basename: CKTOTAL, CKMB, CKMBINDEX, TROPONINI,  in the last 168 hours BNP: BNP (last 3 results) No results found for this basename: PROBNP,  in the last 8760 hours CBG: No results found for this basename: GLUCAP,  in the last 168 hours     Signed:  Rodolph Bong MD [facility] 03/09/2014, 12:02 PM

## 2014-03-09 NOTE — Progress Notes (Addendum)
CSW (Clinical Child psychotherapist) prepared pt dc packet and placed with shadow chart. CSW arranged non-emergent ambulance transport. Pt, pt family, pt nurse, and facility informed. CSW signing off.  ADDENDUM: CSW made aware that pt has not been picked up for transport yet. CSW called [facility] dispatch and it was confirmed a truck will be sent as soon as possible.   Leara Rawl, LCSWA [phone]

## 2014-03-09 NOTE — Progress Notes (Signed)
Pt provided with dc instructions and education. Pt verbalized understanding. Copy of AVs handed to family per request. IV removed with ti pintact. Heart monitor cleaned and returned to front. Levonne Spiller, RN

## 2014-03-10 ENCOUNTER — Encounter: Payer: Self-pay | Admitting: Adult Health

## 2014-03-10 ENCOUNTER — Non-Acute Institutional Stay (SKILLED_NURSING_FACILITY): Payer: Medicare Other | Admitting: Adult Health

## 2014-03-10 DIAGNOSIS — D62 Acute posthemorrhagic anemia: Secondary | ICD-10-CM

## 2014-03-10 DIAGNOSIS — F3289 Other specified depressive episodes: Secondary | ICD-10-CM

## 2014-03-10 DIAGNOSIS — I10 Essential (primary) hypertension: Secondary | ICD-10-CM

## 2014-03-10 DIAGNOSIS — E871 Hypo-osmolality and hyponatremia: Secondary | ICD-10-CM

## 2014-03-10 DIAGNOSIS — R42 Dizziness and giddiness: Secondary | ICD-10-CM

## 2014-03-10 DIAGNOSIS — F32A Depression, unspecified: Secondary | ICD-10-CM | POA: Insufficient documentation

## 2014-03-10 DIAGNOSIS — S32591A Other specified fracture of right pubis, initial encounter for closed fracture: Secondary | ICD-10-CM

## 2014-03-10 DIAGNOSIS — S32509A Unspecified fracture of unspecified pubis, initial encounter for closed fracture: Secondary | ICD-10-CM

## 2014-03-10 DIAGNOSIS — K5900 Constipation, unspecified: Secondary | ICD-10-CM

## 2014-03-10 DIAGNOSIS — S42209A Unspecified fracture of upper end of unspecified humerus, initial encounter for closed fracture: Secondary | ICD-10-CM

## 2014-03-10 DIAGNOSIS — F329 Major depressive disorder, single episode, unspecified: Secondary | ICD-10-CM

## 2014-03-10 DIAGNOSIS — S32511A Fracture of superior rim of right pubis, initial encounter for closed fracture: Secondary | ICD-10-CM

## 2014-03-10 DIAGNOSIS — S4291XA Fracture of right shoulder girdle, part unspecified, initial encounter for closed fracture: Secondary | ICD-10-CM

## 2014-03-10 NOTE — Progress Notes (Signed)
Patient ID: ALEYSSA PALOMAR, female   DOB: [birth year], 80 y.o.   MRN: [id number]               PROGRESS NOTE  DATE: 03/10/2014  FACILITY: Nursing Home Location: [facility]  LEVEL OF CARE: SNF (31)  Acute Visit  CHIEF COMPLAINT:  Follow-up Hospitalization  HISTORY OF PRESENT ILLNESS: This is a 80 year old female who has been admitted to [facility] on 03/09/14 from [facility]. She had a near Syncope and fell at home. She sustained a right shoulder fracture and right inferior/superior pubic ramus. She has been admitted for a short-term rehabilitation.  REASSESSMENT OF ONGOING PROBLEM(S):  HTN: Pt 's HTN remains stable.  Denies CP, sob, DOE, pedal edema, headaches.  Medications currently on hold.  Last BP : 129/61  DEPRESSION: The depression remains stable. Patient denies ongoing feelings of sadness, insomnia, anedhonia or lack of appetite. No complications reported from the medications currently being used. Staff do not report behavioral problems.  CONSTIPATION: The constipation remains stable. No complications from the medications presently being used. Patient denies ongoing constipation, abdominal pain, nausea or vomiting.  PAST MEDICAL HISTORY : Reviewed.  No changes/see problem list  CURRENT MEDICATIONS: Reviewed per MAR/see medication list  REVIEW OF SYSTEMS:  GENERAL: no change in appetite, no fatigue, no weight changes, no fever, chills or weakness RESPIRATORY: no cough, SOB, DOE, wheezing, hemoptysis CARDIAC: no chest pain, edema or palpitations GI: no abdominal pain, diarrhea, constipation, heart burn, nausea or vomiting  PHYSICAL EXAMINATION  GENERAL: no acute distress, normal body habitus EYES: conjunctivae normal, sclerae normal, normal eye lids NECK: supple, trachea midline, no neck masses, no thyroid tenderness, no thyromegaly LYMPHATICS: no LAN in the neck, no supraclavicular LAN RESPIRATORY: breathing is even & unlabored, BS  CTAB CARDIAC: RRR, no murmur,no extra heart sounds, no edema GI: abdomen soft, normal BS, no masses, no tenderness, no hepatomegaly, no splenomegaly EXTREMITIES: able to move all 4 extremities; Right shoulder has sling PSYCHIATRIC: the patient is alert & oriented to person, affect & behavior appropriate  LABS/RADIOLOGY: Labs reviewed: Basic Metabolic Panel:  Recent Labs  63/33/54 1444 03/03/14 2058 03/04/14 0515  03/07/14 0300 03/07/14 1610 03/08/14 0540  NA 140  --  137  < > 128* 130* 133*  K 4.4  --  3.7  < > 3.5* 4.4 4.9  CL 101  --  101  < > 94* 96 99  CO2 26  --  22  < > 27 23 25   GLUCOSE 105*  --  110*  < > 99 111* 89  BUN 10  --  15  < > 10 8 8   CREATININE 0.51 0.53 0.52  < > 0.42* 0.39* 0.36*  CALCIUM 8.7  --  8.9  < > 8.3* 8.7 8.5  MG  --  1.6 1.8  --   --   --   --   < > = values in this interval not displayed. Liver Function Tests:  Recent Labs  03/04/14 0515  AST 25  ALT 14  ALKPHOS 62  BILITOT 0.8  PROT 6.3  ALBUMIN 3.0*   CBC:  Recent Labs  03/03/14 1444  03/05/14 0350 03/07/14 0300 03/08/14 0540  WBC 13.7*  < > 12.3* 13.6* 13.0*  NEUTROABS 11.0*  --   --  8.3*  --   HGB 12.4  < > 9.8* 8.9* 9.1*  HCT 37.2  < > 29.4* 25.7* 26.6*  MCV 91.6  < >  91.3 89.2 90.2  PLT 260  < > 175 201 250  < > = values in this interval not displayed.  ASSESSMENT/PLAN:  Right shoulder fracture - orthopedics recommend immobilization; followup with orthopedics Fracture of right inferior/superior pubic ramus - for rehabilitation Hypertension - stable; check BP q. Shift x1 week; medications on hold due to hypotension Depression - continue Celexa Hyponatremia - check BMP Dizziness - stable; continue Antivert PRN Constipation - continue Colace and start MiraLax 17 g 6-8 ounces of liquid by mouth daily Anemia, acute blood loss - check CBC  CPT CODE: [id number]  Ella BodoMonina Vargas - NP [facility] [phone]

## 2014-03-11 LAB — CBC AND DIFFERENTIAL
HCT: 28 % — AB (ref 36–46)
Hemoglobin: 9 g/dL — AB (ref 12.0–16.0)
Platelets: 365 10*3/uL (ref 150–399)
WBC: 10.8 10^3/mL

## 2014-03-11 LAB — BASIC METABOLIC PANEL
BUN: 8 mg/dL (ref 4–21)
Glucose: 87 mg/dL
POTASSIUM: 3.8 mmol/L (ref 3.4–5.3)
Sodium: 133 mmol/L — AB (ref 137–147)

## 2014-03-14 ENCOUNTER — Encounter: Payer: Self-pay | Admitting: *Deleted

## 2014-03-15 ENCOUNTER — Non-Acute Institutional Stay (SKILLED_NURSING_FACILITY): Payer: Medicare Other | Admitting: Internal Medicine

## 2014-03-15 DIAGNOSIS — S32591A Other specified fracture of right pubis, initial encounter for closed fracture: Secondary | ICD-10-CM

## 2014-03-15 DIAGNOSIS — S42209A Unspecified fracture of upper end of unspecified humerus, initial encounter for closed fracture: Secondary | ICD-10-CM

## 2014-03-15 DIAGNOSIS — I10 Essential (primary) hypertension: Secondary | ICD-10-CM

## 2014-03-15 DIAGNOSIS — S32509A Unspecified fracture of unspecified pubis, initial encounter for closed fracture: Secondary | ICD-10-CM

## 2014-03-15 DIAGNOSIS — S4291XA Fracture of right shoulder girdle, part unspecified, initial encounter for closed fracture: Secondary | ICD-10-CM

## 2014-03-15 DIAGNOSIS — S32511A Fracture of superior rim of right pubis, initial encounter for closed fracture: Secondary | ICD-10-CM

## 2014-03-16 ENCOUNTER — Encounter: Payer: Self-pay | Admitting: *Deleted

## 2014-03-16 NOTE — Progress Notes (Signed)
HISTORY & PHYSICAL  DATE: 03/15/2014   FACILITY: [facility]  LEVEL OF CARE: SNF (31)  ALLERGIES:  Allergies  Allergen Reactions  . Cefaclor Other (See Comments)    Unknown reaction   . Oxycodone Hcl Other (See Comments)    Loss of mental control    CHIEF COMPLAINT:  Manage right shoulder fracture, right pelvic fracture and hypertension  HISTORY OF PRESENT ILLNESS: Patient is a 80 year old Caucasian female.  HUMERUS FRACTURE: Patient fell and sustained a humeral fracture. Orthopedics recommended shoulder immobilizer and pain management. Currently patient wears a sling. Patient is admitted to this facility for short-term rehabilitation. Patient denies current pain. No complications reported from the pain medications presently being used.  PELVIC FRACTURE: The patient had a fall and sustained a pelvic fracture. Conservative management was recommended by orthopedic surgery. Patient is admitted to this facility for short-term rehabilitation. The patient denies ongoing pain. No complications reported from the pain medication(s) currently being used.  HTN: Pt 's HTN remains stable.  Denies CP, sob, DOE, pedal edema, headaches, dizziness or visual disturbances.  No complications from the medications currently being used.  Last BP : 113/54  PAST MEDICAL HISTORY :  Past Medical History  Diagnosis Date  . Hypertension   . Shortness of breath   . Depression   . GERD (gastroesophageal reflux disease)   . Anxiety   . Chronic cough     Followed by Fannie Knee   . Abnormal CXR 04/2010    LLL consolidation/scar; also with lung nodules followed by Hal Stoneking  . PUD (peptic ulcer disease)   . Hyperlipidemia   . Glaucoma   . Osteoporosis   . Diastolic dysfunction   . Allergic rhinitis   . Asthma     "touch"  . Pneumonia 2012  . H/O hiatal hernia   . Arthritis     "knees; back" (03/03/2014)  . Compression fracture     "upper back"  . Shoulder  fracture, right 03/03/2014    S/P mechanical fall    PAST SURGICAL HISTORY: Past Surgical History  Procedure Laterality Date  . Breast biopsy Left 1989  . Tonsillectomy  1920's  . Hammer toe surgery Left   . Toe surgery Left     4th digit; "dr put a screw in it"  . Cataract extraction w/ intraocular lens  implant, bilateral Bilateral   . Dilation and curettage of uterus      SOCIAL HISTORY:  reports that she has never smoked. She has never used smokeless tobacco. She reports that she does not drink alcohol or use illicit drugs.  FAMILY HISTORY:  Family History  Problem Relation Age of Onset  . Malignant hyperthermia Brother   . Asthma Brother   . Heart disease Brother     One had CABG, one deceased at 26 of MI  . Cancer Brother     One had liver ca from EtOH, one had lung from smoking    CURRENT MEDICATIONS: Reviewed per MAR/see medication list  REVIEW OF SYSTEMS:  See HPI otherwise 14 point ROS is negative.  PHYSICAL EXAMINATION  VS:  See VS section  GENERAL: no acute distress, normal body habitus EYES: conjunctivae normal, sclerae normal, normal eye lids MOUTH/THROAT: lips without lesions,no lesions in the mouth,tongue is without lesions,uvula elevates in midline NECK: supple, trachea midline, no neck masses, no thyroid tenderness, no thyromegaly LYMPHATICS: no LAN in the neck, no supraclavicular LAN RESPIRATORY: breathing is  even & unlabored, BS CTAB CARDIAC: RRR, no murmur,no extra heart sounds, no edema GI:  ABDOMEN: abdomen soft, normal BS, no masses, no tenderness  LIVER/SPLEEN: no hepatomegaly, no splenomegaly MUSCULOSKELETAL: HEAD: normal to inspection  EXTREMITIES: LEFT UPPER EXTREMITY: full range of motion, normal strength & tone RIGHT UPPER EXTREMITY:  In sling, not tested LEFT LOWER EXTREMITY: Minimal range of motion, normal strength & tone RIGHT LOWER EXTREMITY:  Not tested due to pelvic fractures PSYCHIATRIC: the patient is alert & oriented to  person, affect & behavior appropriate  LABS/RADIOLOGY:  03-11-14 hemoglobin 9, MCV 94 otherwise CBC normal, BMP normal  Labs reviewed: Basic Metabolic Panel:  Recent Labs  40/98/11 1444 03/03/14 2058 03/04/14 0515  03/07/14 0300 03/07/14 1610 03/08/14 0540  NA 140  --  137  < > 128* 130* 133*  K 4.4  --  3.7  < > 3.5* 4.4 4.9  CL 101  --  101  < > 94* 96 99  CO2 26  --  22  < > 27 23 25   GLUCOSE 105*  --  110*  < > 99 111* 89  BUN 10  --  15  < > 10 8 8   CREATININE 0.51 0.53 0.52  < > 0.42* 0.39* 0.36*  CALCIUM 8.7  --  8.9  < > 8.3* 8.7 8.5  MG  --  1.6 1.8  --   --   --   --   < > = values in this interval not displayed. Liver Function Tests:  Recent Labs  03/04/14 0515  AST 25  ALT 14  ALKPHOS 62  BILITOT 0.8  PROT 6.3  ALBUMIN 3.0*   CBC:  Recent Labs  03/03/14 1444  03/05/14 0350 03/07/14 0300 03/08/14 0540  WBC 13.7*  < > 12.3* 13.6* 13.0*  NEUTROABS 11.0*  --   --  8.3*  --   HGB 12.4  < > 9.8* 8.9* 9.1*  HCT 37.2  < > 29.4* 25.7* 26.6*  MCV 91.6  < > 91.3 89.2 90.2  PLT 260  < > 175 201 250  < > = values in this interval not displayed.   Transthoracic Echocardiography  Patient:    Loray, Akard MR #:       [id number] Study Date: 03/04/2014 Gender:     F Age:        89 Height:     137.2 cm Weight:     55.3 kg BSA:        1.48 m^2 Pt. Status: Room:       3W28C   ADMITTING    Rodolph Bong  ATTENDING    Alvia Grove  REFERRING    Rodolph Bong  SONOGRAPHER  Dewitt Hoes, RDCS  PERFORMING   [facility], Inpatient  cc:  ------------------------------------------------------------------- LV EF: 65% -   70%  ------------------------------------------------------------------- Indications:      Syncope 780.2.  ------------------------------------------------------------------- History:   PMH:   Dyspnea.  Risk factors:  Peptic ulcer disease. Right shoulder fracture. Dizziness. Dehydration.  Fall. Leukocytosis. Glaucoma. Hiatal hernia. DJD. Cough. PNA. Hyponatremia. Hypochloremia. Cellulitis. Hypokalemia. Hypertension. Dyslipidemia.  ------------------------------------------------------------------- Study Conclusions  - Left ventricle: The cavity size was normal. Wall thickness was   normal. Systolic function was vigorous. The estimated ejection   fraction was in the range of 65% to 70%. Wall motion was normal;   there were no regional wall motion abnormalities. Doppler   parameters  are consistent with abnormal left ventricular   relaxation (grade 1 diastolic dysfunction). - Aortic valve: There was mild regurgitation. - Mitral valve: Calcified annulus. - Pulmonary arteries: Systolic pressure was mildly to moderately   increased. PA peak pressure: 55 mm Hg (S).  -------------------------------------------------------------------  ------------------------------------------------------------------- Left ventricle:  The cavity size was normal. Wall thickness was normal. Systolic function was vigorous. The estimated ejection fraction was in the range of 65% to 70%. Wall motion was normal; there were no regional wall motion abnormalities. Doppler parameters are consistent with abnormal left ventricular relaxation (grade 1 diastolic dysfunction).  ------------------------------------------------------------------- Aortic valve:   Mildly thickened, mildly calcified leaflets. Cusp separation was normal.  Doppler:  Transvalvular velocity was within the normal range. There was no stenosis. There was mild regurgitation.  ------------------------------------------------------------------- Aorta:  The aorta was normal, not dilated, and non-diseased.  ------------------------------------------------------------------- Mitral valve:   Calcified annulus. Leaflet separation was normal. Doppler:  Transvalvular velocity was within the normal range. There was no evidence for  stenosis. There was no regurgitation.    Peak gradient (D): 2 mm Hg.  ------------------------------------------------------------------- Left atrium:  The atrium was normal in size.  ------------------------------------------------------------------- Right ventricle:  The cavity size was normal. Wall thickness was normal. Systolic function was normal.  ------------------------------------------------------------------- Pulmonic valve:    Structurally normal valve.   Cusp separation was normal.  Doppler:  Transvalvular velocity was within the normal range. There was no significant regurgitation.  ------------------------------------------------------------------- Tricuspid valve:   Structurally normal valve.   Leaflet separation was normal.  Doppler:  Transvalvular velocity was within the normal range. There was mild regurgitation.  ------------------------------------------------------------------- Pulmonary artery:   The main pulmonary artery was normal-sized. Systolic pressure was mildly to moderately increased.  ------------------------------------------------------------------- Right atrium:  The atrium was normal in size.  ------------------------------------------------------------------- Pericardium:  The pericardium was normal in appearance. There was no pericardial effusion.  ------------------------------------------------------------------- Systemic veins:  Not well visualized.  ------------------------------------------------------------------- Post procedure conclusions Ascending Aorta:  - The aorta was normal, not dilated, and non-diseased.  ------------------------------------------------------------------- Prepared and Electronically Authenticated by  Donato Schultz, M.D. 2015-05-22T12:24:31  ------------------------------------------------------------------- Measurements   Left ventricle                              Value        Reference  LV ID, ED,  PLAX chordal             (L)     34.5  mm     43 - 52  LV ID, ES, PLAX chordal             (N)     24.1  mm     23 - 38  LV fx shortening, PLAX chordal      (N)     30    %      >=29  LV PW thickness, ED                         11.7  mm     ---------  IVS/LV PW ratio, ED                 (N)     0.97         <=1.3  LV e&', lateral  8.11  cm/s   ---------  LV E/e&', lateral                            9.61         ---------  LV e&', medial                               6.03  cm/s   ---------  LV E/e&', medial                             12.92        ---------  LV e&', average                              7.07  cm/s   ---------  LV E/e&', average                            11.02        ---------    Ventricular septum                          Value        Reference  IVS thickness, ED                           11.3  mm     ---------    Aortic valve                                Value        Reference  Aortic regurg pressure half-time            418   ms     ---------    Aorta                                       Value        Reference  Aortic root ID, ED                          27    mm     ---------    Left atrium                                 Value        Reference  LA ID, A-P, ES                              36    mm     ---------  LA ID/bsa, A-P                      (H)     2.44  cm/m^2 <=2.2  LA volume, S  48    ml     ---------  LA volume/bsa, S                            32.5  ml/m^2 ---------    Mitral valve                                Value        Reference  Mitral E-wave peak velocity                 77.9  cm/s   ---------  Mitral A-wave peak velocity                 121   cm/s   ---------  Mitral deceleration time            (H)     232   ms     150 - 230  Mitral peak gradient, D                     2     mm Hg  ---------  Mitral E/A ratio, peak                      0.6          ---------    Pulmonary arteries                           Value        Reference  PA pressure, S, DP                  (H)     55    mm Hg  <=30    Tricuspid valve                             Value        Reference  Tricuspid regurg peak velocity              361   cm/s   ---------  Tricuspid peak RV-RA gradient               52    mm Hg  ---------  Tricuspid maximal regurg velocity,          361   cm/s   ---------  PISA    Systemic veins                              Value        Reference  Estimated CVP                               3     mm Hg  ---------    Right ventricle                             Value        Reference  RV pressure, S, DP                  (H)     55    mm Hg  <=  30  RV s&', lateral, S     Noninvasive Vascular Lab  Carotid Duplex Study  Patient:    Neely, Kammerer MR #:       [id number] Study Date: 03/04/2014 Gender:     F Age:        89 Height: Weight: BSA: Pt. Status: Room:       3W28C   Marin Roberts  SONOGRAPHER  Thereasa Parkin, RVT  Reports also to:  ------------------------------------------------------------------- History and indications:  Indications  780.2 Syncope and collapse.  History  Diagnostic evaluation.  Syncope.  ------------------------------------------------------------------- Study information:  Study status:  Routine.  Procedure:  The right CCA, right ECA, right ICA, right vertebral, left CCA, left ECA, left ICA, and left vertebral arteries were examined. A vascular evaluation was performed with the patient in the supine position.  Arterial flow:  +----------------+---------+-------+--------------+---------------+ Location        V sys    V ed   Flow analysis Comment         +----------------+---------+-------+--------------+---------------+ Right CCA -     64 cm/s  6 cm/s ----------------------------- proximal                                                       +----------------+---------+-------+--------------+---------------+ Right CCA -     72 cm/s  11 cm/s----------------------------- distal                                                        +----------------+---------+-------+--------------+---------------+ Right ECA       -114 cm/s0 cm/s ----------------------------- +----------------+---------+-------+--------------+---------------+ Right ICA -     -111 cm/s-13    --------------Mild            proximal                 cm/s                 heterogeneous                                                 plaque.         +----------------+---------+-------+--------------+---------------+ Right ICA -     -42 cm/s -12    ----------------------------- distal                   cm/s                                 +----------------+---------+-------+--------------+---------------+ Right vertebral 49 cm/s  10 cm/sAntegrade flow--------------- +----------------+---------+-------+--------------+---------------+ Left CCA -      93 cm/s  9 cm/s ----------------------------- proximal                                                      +----------------+---------+-------+--------------+---------------+  Left CCA -      103 cm/s 12 cm/s----------------------------- distal                                                        +----------------+---------+-------+--------------+---------------+ Left ECA        -105 cm/s0 cm/s ----------------------------- +----------------+---------+-------+--------------+---------------+ Left ICA -      91 cm/s  11 cm/s--------------Mild mixed      proximal                                      plaque.         +----------------+---------+-------+--------------+---------------+ Left ICA -      -55 cm/s -14    ----------------------------- distal                   cm/s                                  +----------------+---------+-------+--------------+---------------+ Left vertebral  52 cm/s  8 cm/s Antegrade flow--------------- +----------------+---------+-------+--------------+---------------+  ------------------------------------------------------------------- Summary:  - The vertebral arteries appear patent with antegrade flow. - Findings consistent with 1- 39 percent stenosis involving the   right internal carotid artery and the left internal carotid   artery.  RIGHT SHOULDER - 2+ VIEW   COMPARISON:  None.   FINDINGS: The patient has sustained an impacted subcapital fracture of the right humeral neck with fracture of the greater tuberosity. The glenohumeral joint is intact. [facility] is [facility]. The scapula exhibits no acute abnormality. The observed portions of the right clavicle and upper right ribs appear normal.   IMPRESSION: There is an acute impacted subcapital fracture of the right humerus with fracture involving the greater tuberosity.     PORTABLE CHEST - 1 VIEW   COMPARISON:  Chest radiograph and chest CT November 21, 2012   FINDINGS: There is underlying emphysematous change. There is generalized interstitial prominence which appears more pronounced than on prior study. There is no frank consolidation. The heart size is within normal limits. The pulmonary vascularity reflects underlying emphysema. There is atherosclerotic change in aorta. No adenopathy. There is evidence of a comminuted fracture of the right proximal humerus which may be recent.   IMPRESSION: Interstitial edema superimposed on emphysematous change.   No airspace consolidation.   Recent appearing comminuted fracture of the proximal right humerus with avulsion of the greater tuberosity on the right as well as impaction at the metaphysis -diaphysis junction. CT HEAD WITHOUT CONTRAST   TECHNIQUE: Contiguous axial images were obtained from the base of the skull through the  vertex without intravenous contrast.   COMPARISON:  None.   FINDINGS: Diffusely enlarged ventricles and subarachnoid spaces. Patchy white matter low density in both cerebral hemispheres. No skull fracture, intracranial hemorrhage or paranasal sinus air-fluid levels. Completely opacified sphenoid sinus bilaterally. Bilateral cavernous internal carotid artery atheromatous calcifications and bilateral vertebral and basilar artery atheromatous calcifications.   IMPRESSION: 1. No acute abnormality. 2. Atrophy and chronic small vessel white matter ischemic changes. 3. Chronic sphenoid sinusitis. RIGHT SHOULDER - 2+ VIEW   COMPARISON:  03/03/2014   FINDINGS: The humeral head is located. Negative for dislocation. Bones are osteopenic. Again  seen is an impacted subcapital humeral neck fracture. The fracture involves the greater tuberosity. No significant change compared to radiographs 03/03/2014. The acromioclavicular joint is aligned. No additional fracture site identified.   IMPRESSION: No significant change in impacted subcapital fracture of the right humerus and fracture of the greater tuberosity.   BILATERAL HIP WITH PELVIS - 4+ VIEW   COMPARISON:  None.   FINDINGS: Five views bilateral hip submitted. Bilateral hip joint is located. No femoral fracture. There is displaced fracture of the right superior pubic ramus. Mild displaced fracture of the right inferior pubic ramus. Study is limited by diffuse osteopenia.   IMPRESSION: No hip fracture. Displaced fracture of the right superior pubic ramus. Mild displaced fracture of the right inferior pubic ramus. Limited study by diffuse osteopenia.    ASSESSMENT/PLAN:  Right humerus fracture-continue immobilization and rehabilitation Right pelvic fracture-continue rehabilitation Hypertension-well controlled Constipation-senna and MiraLax were started Allergic rhinitis-well controlled Depression -- continue celexa  I have  reviewed patient's medical records received at admission/from hospitalization.  CPT CODE: 81191  Angela Cox, MD [facility] [phone]

## 2014-03-18 ENCOUNTER — Encounter: Payer: Self-pay | Admitting: *Deleted

## 2014-04-18 ENCOUNTER — Other Ambulatory Visit: Payer: Self-pay | Admitting: *Deleted

## 2014-04-18 MED ORDER — TRAMADOL HCL 50 MG PO TABS: 25.0000 mg | ORAL_TABLET | Freq: Four times a day (QID) | ORAL | Status: AC | PRN

## 2014-04-19 ENCOUNTER — Other Ambulatory Visit: Payer: Self-pay | Admitting: *Deleted

## 2014-04-19 MED ORDER — LORAZEPAM 0.5 MG PO TABS: 0.5000 mg | ORAL_TABLET | Freq: Two times a day (BID) | ORAL | Status: AC | PRN

## 2014-04-20 ENCOUNTER — Non-Acute Institutional Stay (SKILLED_NURSING_FACILITY): Payer: Medicare Other | Admitting: Internal Medicine

## 2014-04-20 DIAGNOSIS — IMO0002 Reserved for concepts with insufficient information to code with codable children: Secondary | ICD-10-CM

## 2014-04-20 DIAGNOSIS — I10 Essential (primary) hypertension: Secondary | ICD-10-CM

## 2014-04-20 DIAGNOSIS — S4291XS Fracture of right shoulder girdle, part unspecified, sequela: Secondary | ICD-10-CM

## 2014-04-20 DIAGNOSIS — S42309S Unspecified fracture of shaft of humerus, unspecified arm, sequela: Secondary | ICD-10-CM

## 2014-04-20 DIAGNOSIS — S32591S Other specified fracture of right pubis, sequela: Secondary | ICD-10-CM

## 2014-04-20 DIAGNOSIS — K5909 Other constipation: Secondary | ICD-10-CM

## 2014-04-21 DIAGNOSIS — S4290XA Fracture of unspecified shoulder girdle, part unspecified, initial encounter for closed fracture: Secondary | ICD-10-CM | POA: Insufficient documentation

## 2014-04-21 DIAGNOSIS — K5909 Other constipation: Secondary | ICD-10-CM | POA: Insufficient documentation

## 2014-04-21 LAB — HEPATIC FUNCTION PANEL
ALT: 6 U/L — AB (ref 7–35)
AST: 15 U/L (ref 13–35)
Bilirubin, Total: 0.4 mg/dL

## 2014-04-21 NOTE — Progress Notes (Signed)
         PROGRESS NOTE  DATE: 04/20/2014  FACILITY: Nursing Home Location: [facility]  LEVEL OF CARE: SNF (31)  Routine Visit  CHIEF COMPLAINT:  Manage right shoulder fracture, hypertension and constipation  HISTORY OF PRESENT ILLNESS:  REASSESSMENT OF ONGOING PROBLEM(S):  HUMERUS FRACTURE: Patient fell and sustained a humeral fracture. Currently patient wears a sling. Patient is admitted to this facility for short-term rehabilitation. Patient denies current pain. No complications reported from the pain medications presently being used.  CONSTIPATION: The constipation remains stable. No complications from the medications presently being used. Patient denies ongoing constipation, abdominal pain, nausea or vomiting.  HTN: Pt 's HTN remains stable.  Denies CP, sob, DOE, headaches, dizziness or visual disturbances.  No complications from the medications currently being used.  Last BP : 133/65, 105/53  PAST MEDICAL HISTORY : Reviewed.  No changes/see problem list  CURRENT MEDICATIONS: Reviewed per MAR/see medication list  REVIEW OF SYSTEMS:  GENERAL: no change in appetite, no fatigue, no weight changes, no fever, chills or weakness RESPIRATORY: no cough, SOB, DOE, wheezing, hemoptysis CARDIAC: no chest pain, or palpitations, complaints of chronic lower extremity swelling GI: no abdominal pain, diarrhea, constipation, heart burn, nausea or vomiting  PHYSICAL EXAMINATION  VS:  See VS section  GENERAL: no acute distress, normal body habitus EYES: conjunctivae normal, sclerae normal, normal eye lids NECK: supple, trachea midline, no neck masses, no thyroid tenderness, no thyromegaly LYMPHATICS: no LAN in the neck, no supraclavicular LAN RESPIRATORY: breathing is even & unlabored, BS CTAB CARDIAC: RRR, no murmur,no extra heart sounds, +2 bilateral lower extremity edema GI: abdomen soft, normal BS, no masses, no tenderness, no hepatomegaly, no  splenomegaly PSYCHIATRIC: the patient is alert & oriented to person, affect & behavior appropriate  LABS/RADIOLOGY: 5-15 hemoglobin 9, MCV 94 otherwise CBC normal, BMP normal  ASSESSMENT/PLAN:  Right shoulder fracture- Continue rehabilitation. Tramadol was increased Constipation-well-controlled Hypertension-well-controlled Pelvic fracture -- denies pain. Continue rehabilitation.  anemia-hemoglobin stable Osteoporosis-continue Miacalcin and calcium plus vitamin D Neuropathy-continue Neurontin Depression-on celexa Check liver profile  CPT CODE: [id number]  Angela CoxGayani Y Dasanayaka, MD [facility] [phone]

## 2014-05-16 ENCOUNTER — Encounter: Payer: Self-pay | Admitting: Adult Health

## 2014-05-16 ENCOUNTER — Non-Acute Institutional Stay (SKILLED_NURSING_FACILITY): Payer: Medicare Other | Admitting: Adult Health

## 2014-05-16 DIAGNOSIS — G589 Mononeuropathy, unspecified: Secondary | ICD-10-CM

## 2014-05-16 DIAGNOSIS — K5900 Constipation, unspecified: Secondary | ICD-10-CM

## 2014-05-16 DIAGNOSIS — F329 Major depressive disorder, single episode, unspecified: Secondary | ICD-10-CM

## 2014-05-16 DIAGNOSIS — J45909 Unspecified asthma, uncomplicated: Secondary | ICD-10-CM

## 2014-05-16 DIAGNOSIS — S4291XD Fracture of right shoulder girdle, part unspecified, subsequent encounter for fracture with routine healing: Secondary | ICD-10-CM

## 2014-05-16 DIAGNOSIS — M810 Age-related osteoporosis without current pathological fracture: Secondary | ICD-10-CM

## 2014-05-16 DIAGNOSIS — F3289 Other specified depressive episodes: Secondary | ICD-10-CM

## 2014-05-16 DIAGNOSIS — S32591D Other specified fracture of right pubis, subsequent encounter for fracture with routine healing: Secondary | ICD-10-CM

## 2014-05-16 DIAGNOSIS — K5909 Other constipation: Secondary | ICD-10-CM

## 2014-05-16 DIAGNOSIS — S42309D Unspecified fracture of shaft of humerus, unspecified arm, subsequent encounter for fracture with routine healing: Secondary | ICD-10-CM

## 2014-05-16 DIAGNOSIS — F32A Depression, unspecified: Secondary | ICD-10-CM

## 2014-05-16 DIAGNOSIS — IMO0001 Reserved for inherently not codable concepts without codable children: Secondary | ICD-10-CM

## 2014-05-16 DIAGNOSIS — G629 Polyneuropathy, unspecified: Secondary | ICD-10-CM

## 2014-05-17 ENCOUNTER — Encounter: Payer: Self-pay | Admitting: Adult Health

## 2014-05-17 NOTE — Progress Notes (Signed)
Patient ID: Molly Cortez, female   DOB: 06/03/1917, 80 y.o.   MRN: [id number]               PROGRESS NOTE  DATE:  05/16/14  FACILITY[facility]  LEVEL OF CARE: SNF (31)  Routine Visit  CHIEF COMPLAINT:  Manage right shoulder fracture, hypertension and constipation  HISTORY OF PRESENT ILLNESS:  REASSESSMENT OF ONGOING PROBLEM(S):  PERIPHERAL NEUROPATHY: The peripheral neuropathy is stable. The patient denies pain in the feet, tingling, and numbness. No complications noted from the medication presently being used.  DEPRESSION: The depression remains stable. Patient denies ongoing feelings of sadness, insomnia, anedhonia or lack of appetite. No complications reported from the medications currently being used. Staff do not report behavioral problems.  OSTEOPOROSIS: The patient's osteoporosis remains stable. The patient denies recent worsening of pain and the range of motion remains stable. There has not been any evidence of fractures. No complications noted from the medications presently being used.   PAST MEDICAL HISTORY : Reviewed.  No changes/see problem list  CURRENT MEDICATIONS: Reviewed per MAR/see medication list  REVIEW OF SYSTEMS:  GENERAL: no change in appetite, no fatigue, no weight changes, no fever, chills or weakness RESPIRATORY: no cough, SOB, DOE, wheezing, hemoptysis CARDIAC: no chest pain, or palpitations, + edema GI: no abdominal pain, diarrhea, constipation, heart burn, nausea or vomiting  PHYSICAL EXAMINATION  GENERAL: no acute distress, normal body habitus NECK: supple, trachea midline, no neck masses, no thyroid tenderness, no thyromegaly LYMPHATICS: no LAN in the neck, no supraclavicular LAN RESPIRATORY: breathing is even & unlabored, BS CTAB CARDIAC: RRR, no murmur,no extra heart sounds, +2 bilateral lower extremity edema GI: abdomen soft, normal BS, no masses, no tenderness, no hepatomegaly, no  splenomegaly PSYCHIATRIC: the patient is alert & oriented to person, affect & behavior appropriate  LABS/RADIOLOGY: 04/21/14  total protein 5.5 albumin 2.6 globulin 2.9 T BIL 0.4 D BIL 0.1  ALP 144  AST 15  ALT 6 5-15 hemoglobin 9, MCV 94 otherwise CBC normal, BMP normal  ASSESSMENT/PLAN:  Right shoulder fracture -  continue rehabilitation Constipation - well-controlled; continue senna S. and MiraLax Pelvic fracture -- denies pain;  continue rehabilitation. Osteoporosis-continue Miacalcin and calcium plus vitamin D Neuropathy-continue Neurontin Depression-on Celexa Asthma - continue QVAR   CPT CODE: [id number]   Ella BodoMonina Vargas - NP [facility] [phone]

## 2014-06-21 ENCOUNTER — Other Ambulatory Visit: Payer: Self-pay | Admitting: *Deleted

## 2014-06-21 MED ORDER — AMBULATORY NON FORMULARY MEDICATION: Status: AC

## 2014-06-21 NOTE — Telephone Encounter (Signed)
[facility] 

## 2014-07-14 DEATH — deceased

## 2016-04-10 IMAGING — CR DG HIP W/ PELVIS BILAT
5 series · 5 of 5 positions shown · non-contrast
Comparison: None.

CLINICAL DATA: Bilateral hip pain post fall

EXAM:
BILATERAL HIP WITH PELVIS - 4+ VIEW

[t pelvis ap]
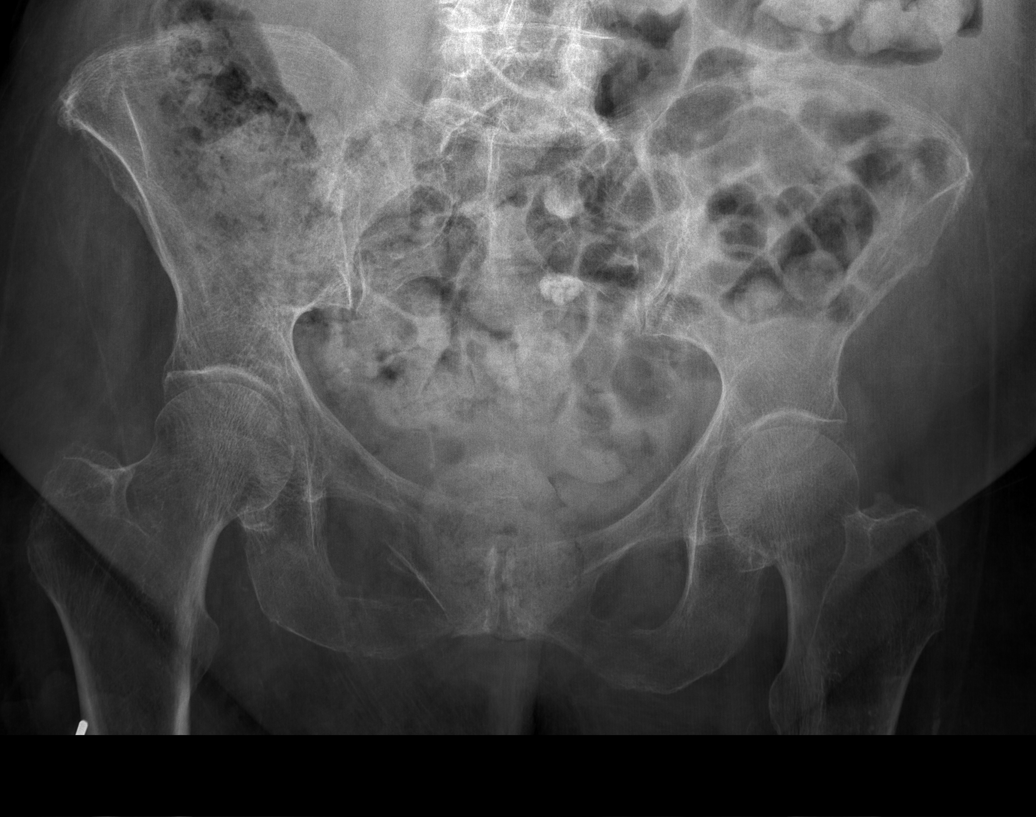

[t hip ap left]
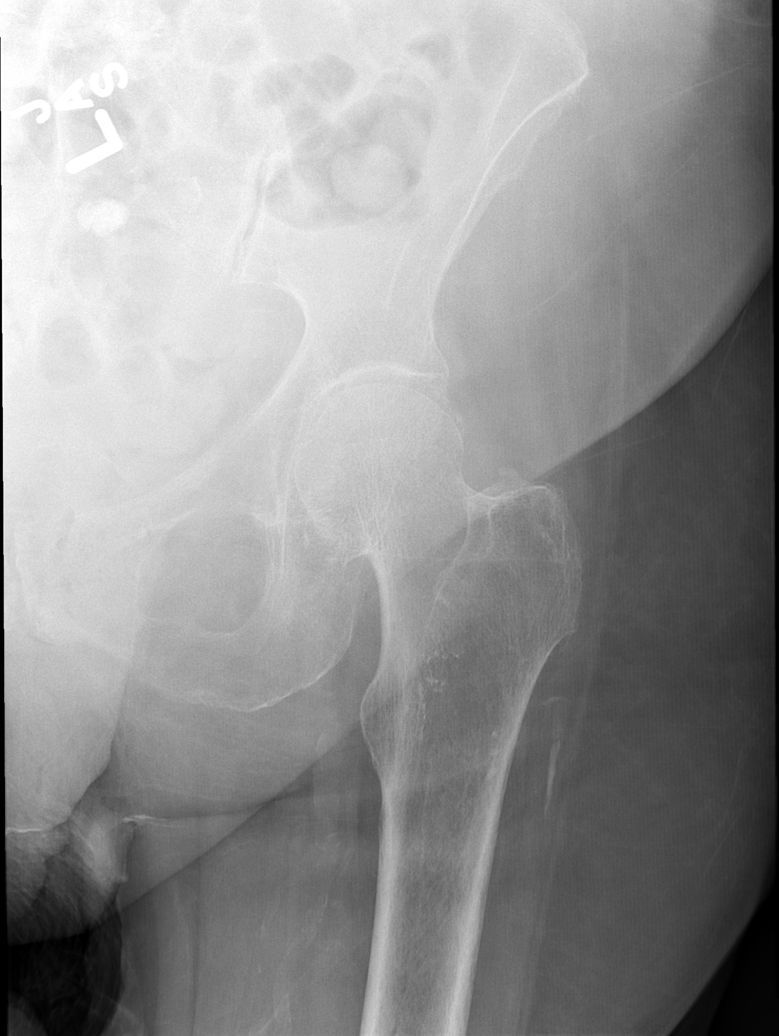

[t hip ap right]
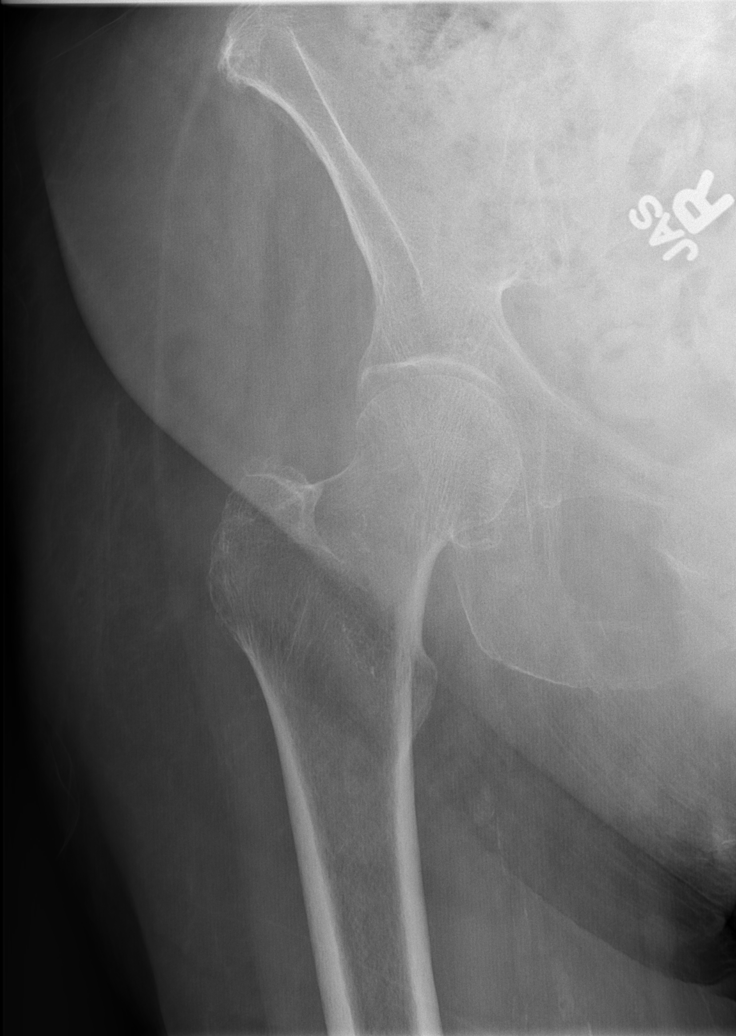

[t hip frog leg right]
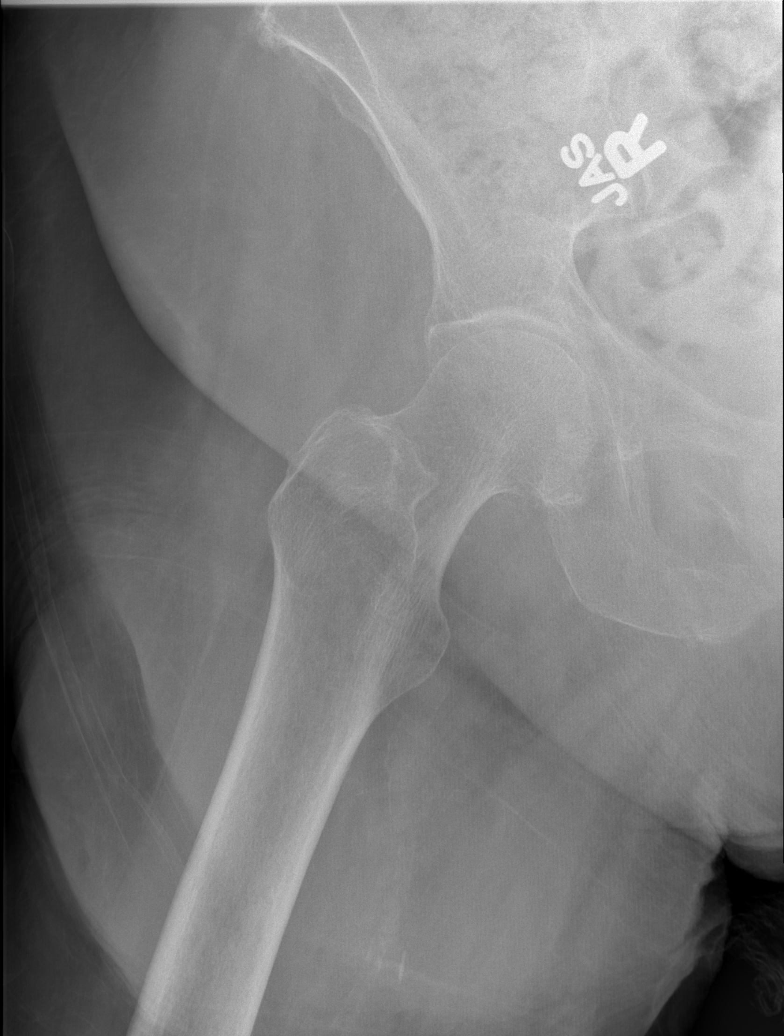

[t hip frog leg left]
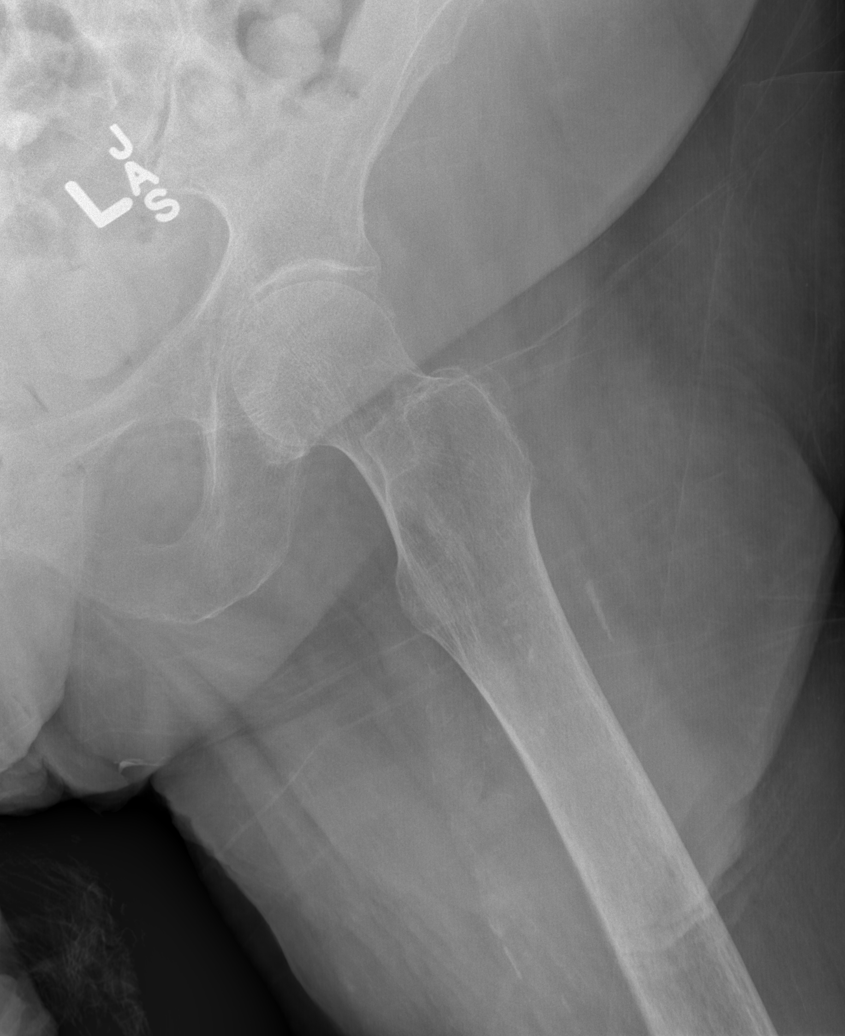

[5 of 5 positions shown; findings below may reference images not displayed]

FINDINGS: Five views bilateral hip submitted. Bilateral hip joint is located.
No femoral fracture. There is displaced fracture of the right
superior pubic ramus. Mild displaced fracture of the right inferior
pubic ramus. Study is limited by diffuse osteopenia.
IMPRESSION: No hip fracture. Displaced fracture of the right superior pubic
ramus. Mild displaced fracture of the right inferior pubic ramus.
Limited study by diffuse osteopenia.
